# Patient Record
Sex: Female | Born: 1985 | Race: White | Hispanic: No | Smoking: Former smoker
Health system: Southern US, Community
[De-identification: ages and names within clinical notes are randomized; demographics above are authoritative.]

## PROBLEM LIST (undated history)

## (undated) DIAGNOSIS — R87619 Unspecified abnormal cytological findings in specimens from cervix uteri: Secondary | ICD-10-CM

## (undated) DIAGNOSIS — F419 Anxiety disorder, unspecified: Secondary | ICD-10-CM

## (undated) DIAGNOSIS — R1115 Cyclical vomiting syndrome unrelated to migraine: Secondary | ICD-10-CM

## (undated) HISTORY — DX: Unspecified abnormal cytological findings in specimens from cervix uteri: R87.619

## (undated) HISTORY — DX: Anxiety disorder, unspecified: F41.9

## (undated) HISTORY — DX: Cyclical vomiting syndrome unrelated to migraine: R11.15

## (undated) HISTORY — PX: ANKLE SURGERY: SHX546

---

## 2005-06-15 ENCOUNTER — Other Ambulatory Visit: Admission: RE | Admit: 2005-06-15 | Discharge: 2005-06-15 | Payer: Self-pay | Admitting: Obstetrics and Gynecology

## 2005-08-13 ENCOUNTER — Ambulatory Visit (HOSPITAL_COMMUNITY): Admission: RE | Admit: 2005-08-13 | Discharge: 2005-08-13 | Payer: Self-pay | Admitting: Obstetrics and Gynecology

## 2005-12-30 ENCOUNTER — Inpatient Hospital Stay (HOSPITAL_COMMUNITY): Admission: AD | Admit: 2005-12-30 | Discharge: 2006-01-01 | Payer: Self-pay | Admitting: Obstetrics and Gynecology

## 2005-12-30 ENCOUNTER — Encounter (INDEPENDENT_AMBULATORY_CARE_PROVIDER_SITE_OTHER): Payer: Self-pay | Admitting: *Deleted

## 2008-02-21 ENCOUNTER — Ambulatory Visit: Payer: Self-pay | Admitting: Physician Assistant

## 2008-02-28 ENCOUNTER — Ambulatory Visit (HOSPITAL_COMMUNITY): Admission: RE | Admit: 2008-02-28 | Discharge: 2008-02-28 | Payer: Self-pay | Admitting: Obstetrics & Gynecology

## 2008-03-28 ENCOUNTER — Ambulatory Visit: Payer: Self-pay | Admitting: Obstetrics & Gynecology

## 2008-04-02 ENCOUNTER — Ambulatory Visit (HOSPITAL_COMMUNITY): Admission: RE | Admit: 2008-04-02 | Discharge: 2008-04-02 | Payer: Self-pay | Admitting: Obstetrics & Gynecology

## 2008-04-17 ENCOUNTER — Ambulatory Visit (HOSPITAL_COMMUNITY): Admission: RE | Admit: 2008-04-17 | Discharge: 2008-04-17 | Payer: Self-pay | Admitting: Obstetrics & Gynecology

## 2008-05-01 ENCOUNTER — Ambulatory Visit: Payer: Self-pay | Admitting: Family

## 2008-05-29 ENCOUNTER — Ambulatory Visit: Payer: Self-pay | Admitting: Obstetrics and Gynecology

## 2008-06-11 ENCOUNTER — Ambulatory Visit: Payer: Self-pay | Admitting: Obstetrics and Gynecology

## 2008-06-15 ENCOUNTER — Ambulatory Visit: Payer: Self-pay | Admitting: Physician Assistant

## 2008-06-18 ENCOUNTER — Encounter: Admission: RE | Admit: 2008-06-18 | Discharge: 2008-06-18 | Payer: Self-pay | Admitting: Obstetrics & Gynecology

## 2008-06-19 ENCOUNTER — Encounter: Payer: Self-pay | Admitting: Obstetrics and Gynecology

## 2008-06-19 LAB — CONVERTED CEMR LAB
MCHC: 33.6 g/dL (ref 30.0–36.0)
Platelets: 246 10*3/uL (ref 150–400)
RDW: 13.1 % (ref 11.5–15.5)

## 2008-07-06 ENCOUNTER — Ambulatory Visit: Payer: Self-pay | Admitting: Physician Assistant

## 2008-07-09 ENCOUNTER — Ambulatory Visit: Payer: Self-pay | Admitting: Occupational Medicine

## 2008-07-23 ENCOUNTER — Ambulatory Visit: Payer: Self-pay | Admitting: Obstetrics and Gynecology

## 2008-08-08 ENCOUNTER — Ambulatory Visit: Payer: Self-pay | Admitting: Obstetrics & Gynecology

## 2008-08-17 ENCOUNTER — Ambulatory Visit: Payer: Self-pay | Admitting: Family

## 2008-08-17 LAB — CONVERTED CEMR LAB: Chlamydia, DNA Probe: NEGATIVE

## 2008-08-18 ENCOUNTER — Encounter: Payer: Self-pay | Admitting: Family

## 2008-08-27 ENCOUNTER — Ambulatory Visit: Payer: Self-pay | Admitting: Family

## 2008-09-03 ENCOUNTER — Ambulatory Visit: Payer: Self-pay | Admitting: Obstetrics and Gynecology

## 2008-09-06 ENCOUNTER — Inpatient Hospital Stay (HOSPITAL_COMMUNITY): Admission: AD | Admit: 2008-09-06 | Discharge: 2008-09-07 | Payer: Self-pay | Admitting: Obstetrics & Gynecology

## 2008-09-06 ENCOUNTER — Ambulatory Visit: Payer: Self-pay | Admitting: Family

## 2008-10-19 ENCOUNTER — Ambulatory Visit: Payer: Self-pay | Admitting: Family

## 2008-11-09 ENCOUNTER — Encounter: Payer: Self-pay | Admitting: Physician Assistant

## 2008-11-09 ENCOUNTER — Ambulatory Visit: Payer: Self-pay | Admitting: Family

## 2008-11-09 ENCOUNTER — Other Ambulatory Visit: Admission: RE | Admit: 2008-11-09 | Discharge: 2008-11-09 | Payer: Self-pay | Admitting: Obstetrics & Gynecology

## 2009-11-18 ENCOUNTER — Ambulatory Visit: Payer: Self-pay | Admitting: Family

## 2009-11-18 ENCOUNTER — Other Ambulatory Visit: Admission: RE | Admit: 2009-11-18 | Discharge: 2009-11-18 | Payer: Self-pay | Admitting: Obstetrics & Gynecology

## 2009-11-19 ENCOUNTER — Encounter: Payer: Self-pay | Admitting: Family

## 2010-09-22 ENCOUNTER — Encounter: Payer: Self-pay | Admitting: Obstetrics & Gynecology

## 2010-12-15 LAB — CBC
HCT: 40.3 % (ref 36.0–46.0)
Hemoglobin: 13.7 g/dL (ref 12.0–15.0)
MCHC: 33.9 g/dL (ref 30.0–36.0)
MCV: 104.1 fL — ABNORMAL HIGH (ref 78.0–100.0)
RBC: 3.87 MIL/uL (ref 3.87–5.11)

## 2011-01-13 NOTE — Assessment & Plan Note (Signed)
NAMEDAN, DISSINGER             ACCOUNT NO.:  [id number]   MEDICAL RECORD NO.:  [id number]          PATIENT TYPE:  POB   LOCATION:  [facility]         FACILITY:  [facility]   PHYSICIAN:  Sid Falcon, CNM  DATE OF BIRTH:  12-13-1985   DATE OF SERVICE:  11/18/2009                                  CLINIC NOTE   The prior patient of practice denied any new medical diagnoses in the  past year and then no new family history.   DIAGNOSES:  Last menstrual period, has not had a cycle since delivery is  still, continuing to breast feed, last unprotected intercourse was 1  week ago.  Was not able to get the colposcopy for the abnormal Pap  smear, low grade squamous intraepithelial lesion due to financial  resources.  The patient also is reporting a right upper quadrant  abdominal pain, have noticed after eating and positive vaginal itching.   PHYSICAL EXAMINATION:  VITAL SIGNS:  Stable.  Pulse 91, blood pressure  121/81, weight 114.  NECK:  No thyromegaly.  CARDIOVASCULAR SYSTEM:  Regular rate and rhythm without murmurs,  gallops, or rubs.  LUNGS:  Clear to her auscultation bilaterally.  BREASTS:  Soft, nontender, no dominant masses.  No retractions.  No  dimpling.  No nipple discharge.  Positive Murphy sign with palpation.  ABDOMEN:  No hepatosplenomegaly.  PELVIS:  Vaginal outside, no external lesions; internal, thin, watery,  yellowish green prostate discharge.  Cervix no abnormal lesions.  Negative cervical motion tenderness.  No discharge from os.   ASSESSMENT:  1. Well-woman exam.  2. Abnormal vaginal discharge.  3. Abdominal pain.   PLAN:  Wet prep to lab, GC and CT to lab.  Pap smear obtained and sent  to lab.  The patient instructed to call [facility] for an  appointment for colposcopy, could pay according to sliding fee scale.  A  prescription given for Garnette Scheuermann, birth control pills 1 daily to abstain  from sex, 1 additional week, take a pregnancy test  if negative.  Begin  onset of pills with backup method for a week and discussed nutrition and  abstaining from high sat, high acidic foods, and to keep a food diary of  when the pain is noted and after eating what type of food.  We will  follow up as needed.      Sid Falcon, CNM     WM/MEDQ  D:  11/18/2009  T:  11/19/2009  Job:  [id number]

## 2011-01-13 NOTE — Assessment & Plan Note (Signed)
Sydney Ryan, Sydney Ryan             ACCOUNT NO.:  [id number]   MEDICAL RECORD NO.:  [id number]          PATIENT TYPE:  POB   LOCATION:  [facility]         FACILITY:  [facility]   PHYSICIAN:  Sid Falcon, CNM  DATE OF BIRTH:  [birth year]   DATE OF SERVICE:                                  CLINIC NOTE   The patient is here for 6-week postpartum exam.  The patient reports  breast-feeding without difficulty.  Bleeding has ceased 1-1/2 weeks ago,  has been sexually active without difficulty.  Has continued with the  Micronor and wants to not switch at this time.  The patient denies  problems with anxiety or depression.  No additional questions or  concerns.  The patient will schedule an appointment in 4 weeks for well-  woman exam.      Sid Falcon, CNM     WM/MEDQ  D:  10/19/2008  T:  10/20/2008  Job:  [id number]

## 2011-01-13 NOTE — Assessment & Plan Note (Signed)
Sydney Ryan, Sydney Ryan             ACCOUNT NO.:  [id number]   MEDICAL RECORD NO.:  [id number]          PATIENT TYPE:  POB   LOCATION:  [facility]         FACILITY:  [facility]   PHYSICIAN:  Sid Falcon, CNM  DATE OF BIRTH:  30-Sep-1985   DATE OF SERVICE:  11/09/2008                                  CLINIC NOTE   The patient is here for a well-woman exam.  No change in medical history  since pregnancy.  Positive breast-feeding without difficulty.   CURRENT MEDICATIONS:  Prenatal vitamins and Kariva oral contraceptive  pills.  The patient would like to continue and does not desire estrogen-  containing OCPs.   Denies any problems at this time and reports decrease in her anxiety.   PHYSICAL EXAMINATION:  GENERAL:  Alert and oriented x3.  No signs of  acute distress.  NECK:  No thyromegaly, nontender, no dominant masses.  CHEST:  Breasts soft, nontender, no dominant masses.  No nipple  discharge.  No retractions bilateral.  LUNGS:  Clear throughout auscultation bilaterally.  CARDIOVASCULAR SYSTEM:  Regular rate and rhythm without murmurs,  gallops, or rubs.  ABDOMEN:  No hepatosplenomegaly.  Positive bowel sounds x4.  GENITOURINARY:  Pelvic, no abnormal lesions.  Vagina, no abnormal  discharge.  Cervix, nontender.  Negative cervical motion tenderness.  No  abnormal lesions.  No abnormal discharge.  Uterus, midline, nontender,  no dominant masses.  Adnexa, nontender.  No dominant masses bilaterally.  EXTREMITIES:  Without edema.  2+ bilateral patellar DTRs.   ASSESSMENT:  1. Well-woman exam.  2. History of abnormal Pap smears.   PLAN:  Discussed the patient should consider using a more effective form  of birth control as the [place] pills have a decreased effectiveness  compared to estrogen-containing birth control pills.  Also recommended  the IUD is a possible method.  Prescription Kariva with refills up to 1  year.   LABORATORY DATA:  Pap smear.   PLAN:  Await Pap  results to determine Pap smear followup.  Well-woman  exam in 1 year.     Sid Falcon, CNM    WM/MEDQ  D:  11/09/2008  T:  11/09/2008  Job:  [id number]

## 2011-08-20 ENCOUNTER — Encounter: Payer: Self-pay | Admitting: *Deleted

## 2011-08-21 ENCOUNTER — Encounter: Payer: Self-pay | Admitting: Physician Assistant

## 2011-08-21 ENCOUNTER — Ambulatory Visit (INDEPENDENT_AMBULATORY_CARE_PROVIDER_SITE_OTHER): Payer: Self-pay | Admitting: Physician Assistant

## 2011-08-21 DIAGNOSIS — Z3480 Encounter for supervision of other normal pregnancy, unspecified trimester: Secondary | ICD-10-CM

## 2011-08-21 NOTE — Progress Notes (Signed)
p-73 Pt does not have insurance, she is applying for medicaid.

## 2011-08-21 NOTE — Patient Instructions (Signed)
Upper Respiratory Infection, Adult An upper respiratory infection (URI) is also known as the common cold. It is often caused by a type of germ (virus). Colds are easily spread (contagious). You can pass it to others by kissing, coughing, sneezing, or drinking out of the same glass. Usually, you get better in 1 or 2 weeks.  HOME CARE   Only take medicine as told by your doctor.   Use a warm mist humidifier or breathe in steam from a hot shower.   Drink enough water and fluids to keep your pee (urine) clear or pale yellow.   Get plenty of rest.   Return to work when your temperature is back to normal or as told by your doctor. You may use a face mask and wash your hands to stop your cold from spreading.  GET HELP RIGHT AWAY IF:   After the first few days, you feel you are getting worse.   You have questions about your medicine.   You have chills, shortness of breath, or brown or red spit (mucus).   You have yellow or brown snot (nasal discharge) or pain in the face, especially when you bend forward.   You have a fever, puffy (swollen) neck, pain when you swallow, or white spots in the back of your throat.   You have a bad headache, ear pain, sinus pain, or chest pain.   You have a high-pitched whistling sound when you breathe in and out (wheezing).   You have a lasting cough or cough up blood.   You have sore muscles or a stiff neck.  MAKE SURE YOU:   Understand these instructions.   Will watch your condition.   Will get help right away if you are not doing well or get worse.  Document Released: 02/03/2008 Document Revised: 04/29/2011 Document Reviewed: 12/22/2010 [facility] Patient Information 2012 [place], [place].Pregnancy - Second Trimester The second trimester of pregnancy (3 to 6 months) is a period of rapid growth for you and your baby. At the end of the sixth month, your baby is about 9 inches long and weighs 1 1/2 pounds. You will begin to feel the baby move between 18  and 20 weeks of the pregnancy. This is called quickening. Weight gain is faster. A clear fluid (colostrum) may leak out of your breasts. You may feel small contractions of the womb (uterus). This is known as false labor or Braxton-Hicks contractions. This is like a practice for labor when the baby is ready to be born. Usually, the problems with morning sickness have usually passed by the end of your first trimester. Some women develop small dark blotches (called cholasma, mask of pregnancy) on their face that usually goes away after the baby is born. Exposure to the sun makes the blotches worse. Acne may also develop in some pregnant women and pregnant women who have acne, may find that it goes away. PRENATAL EXAMS  Blood work may continue to be done during prenatal exams. These tests are done to check on your health and the probable health of your baby. Blood work is used to follow your blood levels (hemoglobin). Anemia (low hemoglobin) is common during pregnancy. Iron and vitamins are given to help prevent this. You will also be checked for diabetes between 24 and 28 weeks of the pregnancy. Some of the previous blood tests may be repeated.   The size of the uterus is measured during each visit. This is to make sure that the baby is continuing to  grow properly according to the dates of the pregnancy.   Your blood pressure is checked every prenatal visit. This is to make sure you are not getting toxemia.   Your urine is checked to make sure you do not have an infection, diabetes or protein in the urine.   Your weight is checked often to make sure gains are happening at the suggested rate. This is to ensure that both you and your baby are growing normally.   Sometimes, an ultrasound is performed to confirm the proper growth and development of the baby. This is a test which bounces harmless sound waves off the baby so your caregiver can more accurately determine due dates.  Sometimes, a specialized test  is done on the amniotic fluid surrounding the baby. This test is called an amniocentesis. The amniotic fluid is obtained by sticking a needle into the belly (abdomen). This is done to check the chromosomes in instances where there is a concern about possible genetic problems with the baby. It is also sometimes done near the end of pregnancy if an early delivery is required. In this case, it is done to help make sure the baby's lungs are mature enough for the baby to live outside of the womb. CHANGES OCCURING IN THE SECOND TRIMESTER OF PREGNANCY Your body goes through many changes during pregnancy. They vary from person to person. Talk to your caregiver about changes you notice that you are concerned about.  During the second trimester, you will likely have an increase in your appetite. It is normal to have cravings for certain foods. This varies from person to person and pregnancy to pregnancy.   Your lower abdomen will begin to bulge.   You may have to urinate more often because the uterus and baby are pressing on your bladder. It is also common to get more bladder infections during pregnancy (pain with urination). You can help this by drinking lots of fluids and emptying your bladder before and after intercourse.   You may begin to get stretch marks on your hips, abdomen, and breasts. These are normal changes in the body during pregnancy. There are no exercises or medications to take that prevent this change.   You may begin to develop swollen and bulging veins (varicose veins) in your legs. Wearing support hose, elevating your feet for 15 minutes, 3 to 4 times a day and limiting salt in your diet helps lessen the problem.   Heartburn may develop as the uterus grows and pushes up against the stomach. Antacids recommended by your caregiver helps with this problem. Also, eating smaller meals 4 to 5 times a day helps.   Constipation can be treated with a stool softener or adding bulk to your diet.  Drinking lots of fluids, vegetables, fruits, and whole grains are helpful.   Exercising is also helpful. If you have been very active up until your pregnancy, most of these activities can be continued during your pregnancy. If you have been less active, it is helpful to start an exercise program such as walking.   Hemorrhoids (varicose veins in the rectum) may develop at the end of the second trimester. Warm sitz baths and hemorrhoid cream recommended by your caregiver helps hemorrhoid problems.   Backaches may develop during this time of your pregnancy. Avoid heavy lifting, wear low heal shoes and practice good posture to help with backache problems.   Some pregnant women develop tingling and numbness of their hand and fingers because of swelling and tightening  of ligaments in the wrist (carpel tunnel syndrome). This goes away after the baby is born.   As your breasts enlarge, you may have to get a bigger bra. Get a comfortable, cotton, support bra. Do not get a nursing bra until the last month of the pregnancy if you will be nursing the baby.   You may get a dark line from your belly button to the pubic area called the linea nigra.   You may develop rosy cheeks because of increase blood flow to the face.   You may develop spider looking lines of the face, neck, arms and chest. These go away after the baby is born.  HOME CARE INSTRUCTIONS   It is extremely important to avoid all smoking, herbs, alcohol, and unprescribed drugs during your pregnancy. These chemicals affect the formation and growth of the baby. Avoid these chemicals throughout the pregnancy to ensure the delivery of a healthy infant.   Most of your home care instructions are the same as suggested for the first trimester of your pregnancy. Keep your caregiver's appointments. Follow your caregiver's instructions regarding medication use, exercise and diet.   During pregnancy, you are providing food for you and your baby. Continue  to eat regular, well-balanced meals. Choose foods such as meat, fish, milk and other low fat dairy products, vegetables, fruits, and whole-grain breads and cereals. Your caregiver will tell you of the ideal weight gain.   A physical sexual relationship may be continued up until near the end of pregnancy if there are no other problems. Problems could include early (premature) leaking of amniotic fluid from the membranes, vaginal bleeding, abdominal pain, or other medical or pregnancy problems.   Exercise regularly if there are no restrictions. Check with your caregiver if you are unsure of the safety of some of your exercises. The greatest weight gain will occur in the last 2 trimesters of pregnancy. Exercise will help you:   Control your weight.   Get you in shape for labor and delivery.   Lose weight after you have the baby.   Wear a good support or jogging bra for breast tenderness during pregnancy. This may help if worn during sleep. Pads or tissues may be used in the bra if you are leaking colostrum.   Do not use hot tubs, steam rooms or saunas throughout the pregnancy.   Wear your seat belt at all times when driving. This protects you and your baby if you are in an accident.   Avoid raw meat, uncooked cheese, cat litter boxes and soil used by cats. These carry germs that can cause birth defects in the baby.   The second trimester is also a good time to visit your dentist for your dental health if this has not been done yet. Getting your teeth cleaned is OK. Use a soft toothbrush. Brush gently during pregnancy.   It is easier to loose urine during pregnancy. Tightening up and strengthening the pelvic muscles will help with this problem. Practice stopping your urination while you are going to the bathroom. These are the same muscles you need to strengthen. It is also the muscles you would use as if you were trying to stop from passing gas. You can practice tightening these muscles up 10  times a set and repeating this about 3 times per day. Once you know what muscles to tighten up, do not perform these exercises during urination. It is more likely to contribute to an infection by backing up the urine.  Ask for help if you have financial, counseling or nutritional needs during pregnancy. Your caregiver will be able to offer counseling for these needs as well as refer you for other special needs.   Your skin may become oily. If so, wash your face with mild soap, use non-greasy moisturizer and oil or cream based makeup.  MEDICATIONS AND DRUG USE IN PREGNANCY  Take prenatal vitamins as directed. The vitamin should contain 1 milligram of folic acid. Keep all vitamins out of reach of children. Only a couple vitamins or tablets containing iron may be fatal to a baby or young child when ingested.   Avoid use of all medications, including herbs, over-the-counter medications, not prescribed or suggested by your caregiver. Only take over-the-counter or prescription medicines for pain, discomfort, or fever as directed by your caregiver. Do not use aspirin.   Let your caregiver also know about herbs you may be using.   Alcohol is related to a number of birth defects. This includes fetal alcohol syndrome. All alcohol, in any form, should be avoided completely. Smoking will cause low birth rate and premature babies.   Street or illegal drugs are very harmful to the baby. They are absolutely forbidden. A baby born to an addicted mother will be addicted at birth. The baby will go through the same withdrawal an adult does.  SEEK MEDICAL CARE IF:  You have any concerns or worries during your pregnancy. It is better to call with your questions if you feel they cannot wait, rather than worry about them. SEEK IMMEDIATE MEDICAL CARE IF:   An unexplained oral temperature above 102 F (38.9 C) develops, or as your caregiver suggests.   You have leaking of fluid from the vagina (birth canal). If  leaking membranes are suspected, take your temperature and tell your caregiver of this when you call.   There is vaginal spotting, bleeding, or passing clots. Tell your caregiver of the amount and how many pads are used. Light spotting in pregnancy is common, especially following intercourse.   You develop a bad smelling vaginal discharge with a change in the color from clear to white.   You continue to feel sick to your stomach (nauseated) and have no relief from remedies suggested. You vomit blood or coffee ground-like materials.   You lose more than 2 pounds of weight or gain more than 2 pounds of weight over 1 week, or as suggested by your caregiver.   You notice swelling of your face, hands, feet, or legs.   You get exposed to [place] measles and have never had them.   You are exposed to fifth disease or chickenpox.   You develop belly (abdominal) pain. Round ligament discomfort is a common non-cancerous (benign) cause of abdominal pain in pregnancy. Your caregiver still must evaluate you.   You develop a bad headache that does not go away.   You develop fever, diarrhea, pain with urination, or shortness of breath.   You develop visual problems, blurry, or double vision.   You fall or are in a car accident or any kind of trauma.   There is mental or physical violence at home.  Document Released: 08/11/2001 Document Revised: 04/29/2011 Document Reviewed: 02/13/2009 [facility] Patient Information 2012 [place], [place].

## 2011-08-21 NOTE — Progress Notes (Signed)
   Subjective:    Sydney Ryan is a [id number] [redacted]w[redacted]d being seen today for her first obstetrical visit.  Her obstetrical history is significant for anxiety. Patient does intend to breast feed. Pregnancy history fully reviewed.  Patient reports no bleeding, no contractions, no cramping, no leaking and URI symptoms w/o fever.  Filed Vitals:   08/21/11 0952  BP: 104/69  Temp: 97.7 F (36.5 C)  Weight: 116 lb (52.617 kg)    HISTORY: OB History    Grav Para Term Preterm Abortions TAB SAB Ect Mult Living   3 2 2       2      # Outc Date GA Lbr Len/2nd Wgt Sex Del Anes PTL Lv   1 TRM 5/07 [redacted]w[redacted]d  8lb2oz(3.685kg) M SVD EPI No Yes   2 TRM 1/10 [redacted]w[redacted]d  8lb8oz(3.856kg) M SVD EPI No Yes   Comments: bilat periurethral lac   3 CUR              Past Medical History  Diagnosis Date  . Abnormal Pap smear of cervix     LGSIL  . Anxiety    No past surgical history on file. Family History  Problem Relation Age of Onset  . Hyperlipidemia Maternal Grandfather   . Diabetes Maternal Grandfather      Exam    Uterine Size: 12-13 cm  Pelvic Exam: Deferred  System: Breast:  normal appearance, no masses or tenderness   Skin: normal coloration and turgor, no rashes    Neurologic: oriented, normal, gait normal; reflexes normal and symmetric   Extremities: normal strength, tone, and muscle mass   HEENT nasal congestion   Mouth/Teeth mucous membranes moist, pharynx normal without lesions   Neck supple and no masses   Cardiovascular: regular rate and rhythm   Respiratory:  appears well, vitals normal, no respiratory distress, acyanotic, normal RR, ear and throat exam is normal, neck free of mass or lymphadenopathy, chest clear, no wheezing, crepitations, rhonchi, normal symmetric air entry   Abdomen: soft, non-tender; bowel sounds normal; no masses,  no organomegaly   Urinary: no suprapubic pressure      Assessment:    Pregnancy: [id number] @ [redacted]w[redacted]d Desiring midwifery care      Plan:     Initial labs drawn. Prenatal vitamins. Problem list reviewed and updated. Genetic Screening discussed Quad Screen: requested.  Ultrasound discussed; fetal survey: Will schedule at next visit.  Follow up in 4 weeks.   Sydney Ryan E. 08/21/2011  New OB. Too late for first screen, desires Quad at next visit. Discussed stopping adderall and xanax. Will drawn labs, pap, and cultures at next visit due to insurance coverage. Safe meds for URI discussed.

## 2011-09-01 NOTE — L&D Delivery Note (Signed)
Delivery Note At 3:52 AM a viable and healthy female was delivered via Vaginal, Spontaneous Delivery (Presentation: ; Occiput Anterior).  APGAR: 8, 9; weight - not available .   Placenta status: Intact, Spontaneous.  Cord: 3 vessels with the following complications: None.  Cord pH: n/a  Anesthesia: Epidural  Episiotomy: None Lacerations: None Suture Repair: n/a Est. Blood Loss (mL): 300 cc  Mom to postpartum.  Baby to nursery-stable.  [facility] 02/27/2012, 4:10 AM

## 2011-10-23 ENCOUNTER — Ambulatory Visit (INDEPENDENT_AMBULATORY_CARE_PROVIDER_SITE_OTHER): Payer: Medicaid Other | Admitting: Family

## 2011-10-23 VITALS — BP 110/59 | Temp 98.5°F | Wt 131.0 lb

## 2011-10-23 DIAGNOSIS — Z3490 Encounter for supervision of normal pregnancy, unspecified, unspecified trimester: Secondary | ICD-10-CM | POA: Insufficient documentation

## 2011-10-23 DIAGNOSIS — Z3480 Encounter for supervision of other normal pregnancy, unspecified trimester: Secondary | ICD-10-CM

## 2011-10-23 MED ORDER — PRENATAL VITAMINS PLUS 27-1 MG PO TABS: 1.0000 | ORAL_TABLET | Freq: Every day | ORAL | Status: DC

## 2011-10-23 NOTE — Progress Notes (Signed)
No questions or concerns; reviewed pregnancy schedule, here with FOB Ivin Booty (caught last baby); labs today; schedule ob ultrasound; need pap, gc/ct next visit

## 2011-10-23 NOTE — Progress Notes (Signed)
p-80 

## 2011-10-24 LAB — OBSTETRIC PANEL
Antibody Screen: NEGATIVE
Basophils Absolute: 0 10*3/uL (ref 0.0–0.1)
Eosinophils Relative: 1 % (ref 0–5)
HCT: 37.5 % (ref 36.0–46.0)
Lymphocytes Relative: 20 % (ref 12–46)
MCHC: 33.9 g/dL (ref 30.0–36.0)
MCV: 99.2 fL (ref 78.0–100.0)
Monocytes Absolute: 0.7 10*3/uL (ref 0.1–1.0)
RDW: 13.1 % (ref 11.5–15.5)
Rubella: 40.3 IU/mL — ABNORMAL HIGH
WBC: 11.3 10*3/uL — ABNORMAL HIGH (ref 4.0–10.5)

## 2011-10-24 LAB — HIV ANTIBODY (ROUTINE TESTING W REFLEX): HIV: NONREACTIVE

## 2011-10-29 ENCOUNTER — Ambulatory Visit (HOSPITAL_COMMUNITY)
Admission: RE | Admit: 2011-10-29 | Discharge: 2011-10-29 | Disposition: A | Discharge status: Home / Self Care | Payer: Medicaid Other | Source: Ambulatory Visit | Attending: Family | Admitting: Family

## 2011-10-29 DIAGNOSIS — Z3490 Encounter for supervision of normal pregnancy, unspecified, unspecified trimester: Secondary | ICD-10-CM

## 2011-10-29 DIAGNOSIS — Z1389 Encounter for screening for other disorder: Secondary | ICD-10-CM | POA: Insufficient documentation

## 2011-10-29 DIAGNOSIS — O358XX0 Maternal care for other (suspected) fetal abnormality and damage, not applicable or unspecified: Secondary | ICD-10-CM | POA: Insufficient documentation

## 2011-10-29 DIAGNOSIS — Z363 Encounter for antenatal screening for malformations: Secondary | ICD-10-CM | POA: Insufficient documentation

## 2011-11-01 ENCOUNTER — Encounter: Payer: Self-pay | Admitting: Family

## 2011-11-03 ENCOUNTER — Encounter: Payer: Medicaid Other | Admitting: Obstetrics & Gynecology

## 2011-11-04 ENCOUNTER — Ambulatory Visit (INDEPENDENT_AMBULATORY_CARE_PROVIDER_SITE_OTHER): Payer: Medicaid Other | Admitting: Obstetrics & Gynecology

## 2011-11-04 VITALS — BP 114/77 | Temp 98.6°F | Wt 129.0 lb

## 2011-11-04 DIAGNOSIS — Z3480 Encounter for supervision of other normal pregnancy, unspecified trimester: Secondary | ICD-10-CM

## 2011-11-04 DIAGNOSIS — O9989 Other specified diseases and conditions complicating pregnancy, childbirth and the puerperium: Secondary | ICD-10-CM

## 2011-11-04 DIAGNOSIS — O26899 Other specified pregnancy related conditions, unspecified trimester: Secondary | ICD-10-CM

## 2011-11-04 LAB — POCT URINALYSIS DIPSTICK
Leukocytes, UA: NEGATIVE
Spec Grav, UA: 1.015
pH, UA: 6.5

## 2011-11-04 NOTE — Progress Notes (Signed)
This is an unscheduled appt. She came in for 2 days of abdominal pain/constipation. She reports good FM, denies VB and ROM. Her cervix exam is normal and we have discussed constipation treatment. PTL precautions.

## 2011-11-04 NOTE — Progress Notes (Signed)
p-72  Increase abd and back pain.  She has not had a BM in 2 days and the last was harder than normal.  She was worried about the pregnancy since the abd pain was so intense.

## 2011-11-30 ENCOUNTER — Ambulatory Visit (INDEPENDENT_AMBULATORY_CARE_PROVIDER_SITE_OTHER): Payer: Medicaid Other | Admitting: Advanced Practice Midwife

## 2011-11-30 ENCOUNTER — Other Ambulatory Visit (HOSPITAL_COMMUNITY)
Admission: RE | Admit: 2011-11-30 | Discharge: 2011-11-30 | Disposition: A | Discharge status: Home / Self Care | Payer: Medicaid Other | Source: Ambulatory Visit | Attending: Obstetrics & Gynecology | Admitting: Obstetrics & Gynecology

## 2011-11-30 ENCOUNTER — Encounter: Payer: Self-pay | Admitting: Advanced Practice Midwife

## 2011-11-30 VITALS — BP 110/60 | Temp 98.0°F | Wt 138.0 lb

## 2011-11-30 DIAGNOSIS — Z01419 Encounter for gynecological examination (general) (routine) without abnormal findings: Secondary | ICD-10-CM | POA: Insufficient documentation

## 2011-11-30 DIAGNOSIS — Z87898 Personal history of other specified conditions: Secondary | ICD-10-CM

## 2011-11-30 DIAGNOSIS — Z3480 Encounter for supervision of other normal pregnancy, unspecified trimester: Secondary | ICD-10-CM

## 2011-11-30 DIAGNOSIS — Z113 Encounter for screening for infections with a predominantly sexual mode of transmission: Secondary | ICD-10-CM | POA: Insufficient documentation

## 2011-11-30 DIAGNOSIS — Z8742 Personal history of other diseases of the female genital tract: Secondary | ICD-10-CM

## 2011-11-30 DIAGNOSIS — Z3490 Encounter for supervision of normal pregnancy, unspecified, unspecified trimester: Secondary | ICD-10-CM

## 2011-11-30 NOTE — Progress Notes (Signed)
Well, doing 28 week labs next visit, pap and GC done today. Rev'd kick counts, PTL, bleeding precautions.

## 2011-11-30 NOTE — Patient Instructions (Signed)
Pregnancy - Third Trimester The third trimester of pregnancy (the last 3 months) is a period of the most rapid growth for you and your baby. The baby approaches a length of 20 inches and a weight of 6 to 10 pounds. The baby is adding on fat and getting ready for life outside your body. While inside, babies have periods of sleeping and waking, suck their thumbs, and hiccups. You can often feel small contractions of the uterus. This is false labor. It is also called Braxton-Hicks contractions. This is like a practice for labor. The usual problems in this stage of pregnancy include more difficulty breathing, swelling of the hands and feet from water retention, and having to urinate more often because of the uterus and baby pressing on your bladder.  PRENATAL EXAMS  Blood work may continue to be done during prenatal exams. These tests are done to check on your health and the probable health of your baby. Blood work is used to follow your blood levels (hemoglobin). Anemia (low hemoglobin) is common during pregnancy. Iron and vitamins are given to help prevent this. You may also continue to be checked for diabetes. Some of the past blood tests may be done again.   The size of the uterus is measured during each visit. This makes sure your baby is growing properly according to your pregnancy dates.   Your blood pressure is checked every prenatal visit. This is to make sure you are not getting toxemia.   Your urine is checked every prenatal visit for infection, diabetes and protein.   Your weight is checked at each visit. This is done to make sure gains are happening at the suggested rate and that you and your baby are growing normally.   Sometimes, an ultrasound is performed to confirm the position and the proper growth and development of the baby. This is a test done that bounces harmless sound waves off the baby so your caregiver can more accurately determine due dates.   Discuss the type of pain  medication and anesthesia you will have during your labor and delivery.   Discuss the possibility and anesthesia if a Cesarean Section might be necessary.   Inform your caregiver if there is any mental or physical violence at home.  Sometimes, a specialized non-stress test, contraction stress test and biophysical profile are done to make sure the baby is not having a problem. Checking the amniotic fluid surrounding the baby is called an amniocentesis. The amniotic fluid is removed by sticking a needle into the belly (abdomen). This is sometimes done near the end of pregnancy if an early delivery is required. In this case, it is done to help make sure the baby's lungs are mature enough for the baby to live outside of the womb. If the lungs are not mature and it is unsafe to deliver the baby, an injection of cortisone medication is given to the mother 1 to 2 days before the delivery. This helps the baby's lungs mature and makes it safer to deliver the baby. CHANGES OCCURING IN THE THIRD TRIMESTER OF PREGNANCY Your body goes through many changes during pregnancy. They vary from person to person. Talk to your caregiver about changes you notice and are concerned about.  During the last trimester, you have probably had an increase in your appetite. It is normal to have cravings for certain foods. This varies from person to person and pregnancy to pregnancy.   You may begin to get stretch marks on your hips,   abdomen, and breasts. These are normal changes in the body during pregnancy. There are no exercises or medications to take which prevent this change.   Constipation may be treated with a stool softener or adding bulk to your diet. Drinking lots of fluids, fiber in vegetables, fruits, and whole grains are helpful.   Exercising is also helpful. If you have been very active up until your pregnancy, most of these activities can be continued during your pregnancy. If you have been less active, it is helpful  to start an exercise program such as walking. Consult your caregiver before starting exercise programs.   Avoid all smoking, alcohol, un-prescribed drugs, herbs and "street drugs" during your pregnancy. These chemicals affect the formation and growth of the baby. Avoid chemicals throughout the pregnancy to ensure the delivery of a healthy infant.   Backache, varicose veins and hemorrhoids may develop or get worse.   You will tire more easily in the third trimester, which is normal.   The baby's movements may be stronger and more often.   You may become short of breath easily.   Your belly button may stick out.   A yellow discharge may leak from your breasts called colostrum.   You may have a bloody mucus discharge. This usually occurs a few days to a week before labor begins.  HOME CARE INSTRUCTIONS   Keep your caregiver's appointments. Follow your caregiver's instructions regarding medication use, exercise, and diet.   During pregnancy, you are providing food for you and your baby. Continue to eat regular, well-balanced meals. Choose foods such as meat, fish, milk and other low fat dairy products, vegetables, fruits, and whole-grain breads and cereals. Your caregiver will tell you of the ideal weight gain.   A physical sexual relationship may be continued throughout pregnancy if there are no other problems such as early (premature) leaking of amniotic fluid from the membranes, vaginal bleeding, or belly (abdominal) pain.   Exercise regularly if there are no restrictions. Check with your caregiver if you are unsure of the safety of your exercises. Greater weight gain will occur in the last 2 trimesters of pregnancy. Exercising helps:   Control your weight.   Get you in shape for labor and delivery.   You lose weight after you deliver.   Rest a lot with legs elevated, or as needed for leg cramps or low back pain.   Wear a good support or jogging bra for breast tenderness during  pregnancy. This may help if worn during sleep. Pads or tissues may be used in the bra if you are leaking colostrum.   Do not use hot tubs, steam rooms, or saunas.   Wear your seat belt when driving. This protects you and your baby if you are in an accident.   Avoid raw meat, cat litter boxes and soil used by cats. These carry germs that can cause birth defects in the baby.   It is easier to loose urine during pregnancy. Tightening up and strengthening the pelvic muscles will help with this problem. You can practice stopping your urination while you are going to the bathroom. These are the same muscles you need to strengthen. It is also the muscles you would use if you were trying to stop from passing gas. You can practice tightening these muscles up 10 times a set and repeating this about 3 times per day. Once you know what muscles to tighten up, do not perform these exercises during urination. It is more likely   to cause an infection by backing up the urine.   Ask for help if you have financial, counseling or nutritional needs during pregnancy. Your caregiver will be able to offer counseling for these needs as well as refer you for other special needs.   Make a list of emergency phone numbers and have them available.   Plan on getting help from family or friends when you go home from the hospital.   Make a trial run to the hospital.   Take prenatal classes with the father to understand, practice and ask questions about the labor and delivery.   Prepare the baby's room/nursery.   Do not travel out of the city unless it is absolutely necessary and with the advice of your caregiver.   Wear only low or no heal shoes to have better balance and prevent falling.  MEDICATIONS AND DRUG USE IN PREGNANCY  Take prenatal vitamins as directed. The vitamin should contain 1 milligram of folic acid. Keep all vitamins out of reach of children. Only a couple vitamins or tablets containing iron may be fatal  to a baby or young child when ingested.   Avoid use of all medications, including herbs, over-the-counter medications, not prescribed or suggested by your caregiver. Only take over-the-counter or prescription medicines for pain, discomfort, or fever as directed by your caregiver. Do not use aspirin, ibuprofen (Motrin, Advil, Nuprin) or naproxen (Aleve) unless OK'd by your caregiver.   Let your caregiver also know about herbs you may be using.   Alcohol is related to a number of birth defects. This includes fetal alcohol syndrome. All alcohol, in any form, should be avoided completely. Smoking will cause low birth rate and premature babies.   Street/illegal drugs are very harmful to the baby. They are absolutely forbidden. A baby born to an addicted mother will be addicted at birth. The baby will go through the same withdrawal an adult does.  SEEK MEDICAL CARE IF: You have any concerns or worries during your pregnancy. It is better to call with your questions if you feel they cannot wait, rather than worry about them. DECISIONS ABOUT CIRCUMCISION You may or may not know the sex of your baby. If you know your baby is a boy, it may be time to think about circumcision. Circumcision is the removal of the foreskin of the penis. This is the skin that covers the sensitive end of the penis. There is no proven medical need for this. Often this decision is made on what is popular at the time or based upon religious beliefs and social issues. You can discuss these issues with your caregiver or pediatrician. SEEK IMMEDIATE MEDICAL CARE IF:   An unexplained oral temperature above 102 F (38.9 C) develops, or as your caregiver suggests.   You have leaking of fluid from the vagina (birth canal). If leaking membranes are suspected, take your temperature and tell your caregiver of this when you call.   There is vaginal spotting, bleeding or passing clots. Tell your caregiver of the amount and how many pads are  used.   You develop a bad smelling vaginal discharge with a change in the color from clear to white.   You develop vomiting that lasts more than 24 hours.   You develop chills or fever.   You develop shortness of breath.   You develop burning on urination.   You loose more than 2 pounds of weight or gain more than 2 pounds of weight or as suggested by your   caregiver.   You notice sudden swelling of your face, hands, and feet or legs.   You develop belly (abdominal) pain. Round ligament discomfort is a common non-cancerous (benign) cause of abdominal pain in pregnancy. Your caregiver still must evaluate you.   You develop a severe headache that does not go away.   You develop visual problems, blurred or double vision.   If you have not felt your baby move for more than 1 hour. If you think the baby is not moving as much as usual, eat something with sugar in it and lie down on your left side for an hour. The baby should move at least 4 to 5 times per hour. Call right away if your baby moves less than that.   You fall, are in a car accident or any kind of trauma.   There is mental or physical violence at home.  Document Released: 08/11/2001 Document Revised: 08/06/2011 Document Reviewed: 02/13/2009 [employer] Patient Information 2012 [employer]. 

## 2011-11-30 NOTE — Progress Notes (Signed)
p-[id number]  Pt will do 28 wk labs next week  Pt does need her Pap smear and GC/CT done today

## 2011-12-02 ENCOUNTER — Telehealth: Payer: Self-pay | Admitting: *Deleted

## 2011-12-02 NOTE — Telephone Encounter (Signed)
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  She needs to be scheduled for a colposcopy here in the office.  Pt is to check her schedule and call back to schedule.  Pt wanted to postpone until after birth of baby but since she never followed up on last abnormal pap, I told her it needs to be done now.

## 2011-12-04 ENCOUNTER — Other Ambulatory Visit: Payer: Medicaid Other | Admitting: *Deleted

## 2011-12-04 ENCOUNTER — Other Ambulatory Visit: Payer: Self-pay | Admitting: Advanced Practice Midwife

## 2011-12-05 LAB — CBC
HCT: 38.7 % (ref 36.0–46.0)
Hemoglobin: 12.7 g/dL (ref 12.0–15.0)
MCH: 33.4 pg (ref 26.0–34.0)
MCV: 101.8 fL — ABNORMAL HIGH (ref 78.0–100.0)
RBC: 3.8 MIL/uL — ABNORMAL LOW (ref 3.87–5.11)

## 2011-12-18 ENCOUNTER — Telehealth: Payer: Self-pay | Admitting: Family

## 2011-12-18 ENCOUNTER — Ambulatory Visit (INDEPENDENT_AMBULATORY_CARE_PROVIDER_SITE_OTHER): Payer: Medicaid Other | Admitting: Family

## 2011-12-18 VITALS — BP 109/67 | Temp 98.2°F | Wt 137.0 lb

## 2011-12-18 DIAGNOSIS — Z3480 Encounter for supervision of other normal pregnancy, unspecified trimester: Secondary | ICD-10-CM

## 2011-12-18 DIAGNOSIS — IMO0002 Reserved for concepts with insufficient information to code with codable children: Secondary | ICD-10-CM

## 2011-12-18 DIAGNOSIS — Z3490 Encounter for supervision of normal pregnancy, unspecified, unspecified trimester: Secondary | ICD-10-CM

## 2011-12-18 DIAGNOSIS — R87613 High grade squamous intraepithelial lesion on cytologic smear of cervix (HGSIL): Secondary | ICD-10-CM

## 2011-12-18 MED ORDER — NEOMYCIN-POLYMYXIN-HC 3.5-10000-1 OT SOLN: 3.0000 [drp] | Freq: Four times a day (QID) | OTIC | Status: AC

## 2011-12-18 NOTE — Progress Notes (Signed)
Can feel heart beating in her ear.  P-[id number]   Electrical shock pain  In both legs when she stands up.

## 2011-12-18 NOTE — Progress Notes (Signed)
Starting to feel tired; discussed high grade lesion on pap smear; pt does not want procedures done; discussed concern regarding result.  Ears - left otitis externa> RX cortisporin otic; tympanic membrane intact bilat, no redness on eardrum; left canal inflammed.

## 2011-12-18 NOTE — Telephone Encounter (Signed)
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

## 2012-01-08 ENCOUNTER — Ambulatory Visit (INDEPENDENT_AMBULATORY_CARE_PROVIDER_SITE_OTHER): Payer: Medicaid Other | Admitting: Obstetrics and Gynecology

## 2012-01-08 VITALS — BP 116/69 | Temp 97.4°F | Wt 143.0 lb

## 2012-01-08 DIAGNOSIS — IMO0002 Reserved for concepts with insufficient information to code with codable children: Secondary | ICD-10-CM

## 2012-01-08 DIAGNOSIS — Z3480 Encounter for supervision of other normal pregnancy, unspecified trimester: Secondary | ICD-10-CM

## 2012-01-08 DIAGNOSIS — Z3493 Encounter for supervision of normal pregnancy, unspecified, third trimester: Secondary | ICD-10-CM

## 2012-01-08 DIAGNOSIS — R87613 High grade squamous intraepithelial lesion on cytologic smear of cervix (HGSIL): Secondary | ICD-10-CM

## 2012-01-08 NOTE — Progress Notes (Signed)
Occ episodes blurry vision relieved with rest, not present now. Also noted with last pregnnacy. Has optometry appt. Tried ear gtts but still occasionally hears her pulse in left ear.  Discussed plans. Works at home.

## 2012-01-08 NOTE — Patient Instructions (Signed)
Pregnancy - Third Trimester The third trimester of pregnancy (the last 3 months) is a period of the most rapid growth for you and your baby. The baby approaches a length of 20 inches and a weight of 6 to 10 pounds. The baby is adding on fat and getting ready for life outside your body. While inside, babies have periods of sleeping and waking, suck their thumbs, and hiccups. You can often feel small contractions of the uterus. This is false labor. It is also called Braxton-Hicks contractions. This is like a practice for labor. The usual problems in this stage of pregnancy include more difficulty breathing, swelling of the hands and feet from water retention, and having to urinate more often because of the uterus and baby pressing on your bladder.  PRENATAL EXAMS  Blood work may continue to be done during prenatal exams. These tests are done to check on your health and the probable health of your baby. Blood work is used to follow your blood levels (hemoglobin). Anemia (low hemoglobin) is common during pregnancy. Iron and vitamins are given to help prevent this. You may also continue to be checked for diabetes. Some of the past blood tests may be done again.   The size of the uterus is measured during each visit. This makes sure your baby is growing properly according to your pregnancy dates.   Your blood pressure is checked every prenatal visit. This is to make sure you are not getting toxemia.   Your urine is checked every prenatal visit for infection, diabetes and protein.   Your weight is checked at each visit. This is done to make sure gains are happening at the suggested rate and that you and your baby are growing normally.   Sometimes, an ultrasound is performed to confirm the position and the proper growth and development of the baby. This is a test done that bounces harmless sound waves off the baby so your caregiver can more accurately determine due dates.   Discuss the type of pain  medication and anesthesia you will have during your labor and delivery.   Discuss the possibility and anesthesia if a Cesarean Section might be necessary.   Inform your caregiver if there is any mental or physical violence at home.  Sometimes, a specialized non-stress test, contraction stress test and biophysical profile are done to make sure the baby is not having a problem. Checking the amniotic fluid surrounding the baby is called an amniocentesis. The amniotic fluid is removed by sticking a needle into the belly (abdomen). This is sometimes done near the end of pregnancy if an early delivery is required. In this case, it is done to help make sure the baby's lungs are mature enough for the baby to live outside of the womb. If the lungs are not mature and it is unsafe to deliver the baby, an injection of cortisone medication is given to the mother 1 to 2 days before the delivery. This helps the baby's lungs mature and makes it safer to deliver the baby. CHANGES OCCURING IN THE THIRD TRIMESTER OF PREGNANCY Your body goes through many changes during pregnancy. They vary from person to person. Talk to your caregiver about changes you notice and are concerned about.  During the last trimester, you have probably had an increase in your appetite. It is normal to have cravings for certain foods. This varies from person to person and pregnancy to pregnancy.   You may begin to get stretch marks on your hips,   abdomen, and breasts. These are normal changes in the body during pregnancy. There are no exercises or medications to take which prevent this change.   Constipation may be treated with a stool softener or adding bulk to your diet. Drinking lots of fluids, fiber in vegetables, fruits, and whole grains are helpful.   Exercising is also helpful. If you have been very active up until your pregnancy, most of these activities can be continued during your pregnancy. If you have been less active, it is helpful  to start an exercise program such as walking. Consult your caregiver before starting exercise programs.   Avoid all smoking, alcohol, un-prescribed drugs, herbs and "street drugs" during your pregnancy. These chemicals affect the formation and growth of the baby. Avoid chemicals throughout the pregnancy to ensure the delivery of a healthy infant.   Backache, varicose veins and hemorrhoids may develop or get worse.   You will tire more easily in the third trimester, which is normal.   The baby's movements may be stronger and more often.   You may become short of breath easily.   Your belly button may stick out.   A yellow discharge may leak from your breasts called colostrum.   You may have a bloody mucus discharge. This usually occurs a few days to a week before labor begins.  HOME CARE INSTRUCTIONS   Keep your caregiver's appointments. Follow your caregiver's instructions regarding medication use, exercise, and diet.   During pregnancy, you are providing food for you and your baby. Continue to eat regular, well-balanced meals. Choose foods such as meat, fish, milk and other low fat dairy products, vegetables, fruits, and whole-grain breads and cereals. Your caregiver will tell you of the ideal weight gain.   A physical sexual relationship may be continued throughout pregnancy if there are no other problems such as early (premature) leaking of amniotic fluid from the membranes, vaginal bleeding, or belly (abdominal) pain.   Exercise regularly if there are no restrictions. Check with your caregiver if you are unsure of the safety of your exercises. Greater weight gain will occur in the last 2 trimesters of pregnancy. Exercising helps:   Control your weight.   Get you in shape for labor and delivery.   You lose weight after you deliver.   Rest a lot with legs elevated, or as needed for leg cramps or low back pain.   Wear a good support or jogging bra for breast tenderness during  pregnancy. This may help if worn during sleep. Pads or tissues may be used in the bra if you are leaking colostrum.   Do not use hot tubs, steam rooms, or saunas.   Wear your seat belt when driving. This protects you and your baby if you are in an accident.   Avoid raw meat, cat litter boxes and soil used by cats. These carry germs that can cause birth defects in the baby.   It is easier to loose urine during pregnancy. Tightening up and strengthening the pelvic muscles will help with this problem. You can practice stopping your urination while you are going to the bathroom. These are the same muscles you need to strengthen. It is also the muscles you would use if you were trying to stop from passing gas. You can practice tightening these muscles up 10 times a set and repeating this about 3 times per day. Once you know what muscles to tighten up, do not perform these exercises during urination. It is more likely   to cause an infection by backing up the urine.   Ask for help if you have financial, counseling or nutritional needs during pregnancy. Your caregiver will be able to offer counseling for these needs as well as refer you for other special needs.   Make a list of emergency phone numbers and have them available.   Plan on getting help from family or friends when you go home from the hospital.   Make a trial run to the hospital.   Take prenatal classes with the father to understand, practice and ask questions about the labor and delivery.   Prepare the baby's room/nursery.   Do not travel out of the city unless it is absolutely necessary and with the advice of your caregiver.   Wear only low or no heal shoes to have better balance and prevent falling.  MEDICATIONS AND DRUG USE IN PREGNANCY  Take prenatal vitamins as directed. The vitamin should contain 1 milligram of folic acid. Keep all vitamins out of reach of children. Only a couple vitamins or tablets containing iron may be fatal  to a baby or young child when ingested.   Avoid use of all medications, including herbs, over-the-counter medications, not prescribed or suggested by your caregiver. Only take over-the-counter or prescription medicines for pain, discomfort, or fever as directed by your caregiver. Do not use aspirin, ibuprofen (Motrin, Advil, Nuprin) or naproxen (Aleve) unless OK'd by your caregiver.   Let your caregiver also know about herbs you may be using.   Alcohol is related to a number of birth defects. This includes fetal alcohol syndrome. All alcohol, in any form, should be avoided completely. Smoking will cause low birth rate and premature babies.   Street/illegal drugs are very harmful to the baby. They are absolutely forbidden. A baby born to an addicted mother will be addicted at birth. The baby will go through the same withdrawal an adult does.  SEEK MEDICAL CARE IF: You have any concerns or worries during your pregnancy. It is better to call with your questions if you feel they cannot wait, rather than worry about them. DECISIONS ABOUT CIRCUMCISION You may or may not know the sex of your baby. If you know your baby is a boy, it may be time to think about circumcision. Circumcision is the removal of the foreskin of the penis. This is the skin that covers the sensitive end of the penis. There is no proven medical need for this. Often this decision is made on what is popular at the time or based upon religious beliefs and social issues. You can discuss these issues with your caregiver or pediatrician. SEEK IMMEDIATE MEDICAL CARE IF:   An unexplained oral temperature above 102 F (38.9 C) develops, or as your caregiver suggests.   You have leaking of fluid from the vagina (birth canal). If leaking membranes are suspected, take your temperature and tell your caregiver of this when you call.   There is vaginal spotting, bleeding or passing clots. Tell your caregiver of the amount and how many pads are  used.   You develop a bad smelling vaginal discharge with a change in the color from clear to white.   You develop vomiting that lasts more than 24 hours.   You develop chills or fever.   You develop shortness of breath.   You develop burning on urination.   You loose more than 2 pounds of weight or gain more than 2 pounds of weight or as suggested by your   caregiver.   You notice sudden swelling of your face, hands, and feet or legs.   You develop belly (abdominal) pain. Round ligament discomfort is a common non-cancerous (benign) cause of abdominal pain in pregnancy. Your caregiver still must evaluate you.   You develop a severe headache that does not go away.   You develop visual problems, blurred or double vision.   If you have not felt your baby move for more than 1 hour. If you think the baby is not moving as much as usual, eat something with sugar in it and lie down on your left side for an hour. The baby should move at least 4 to 5 times per hour. Call right away if your baby moves less than that.   You fall, are in a car accident or any kind of trauma.   There is mental or physical violence at home.  Document Released: 08/11/2001 Document Revised: 08/06/2011 Document Reviewed: 02/13/2009 [employer] Patient Information 2012 [employer]. 

## 2012-01-08 NOTE — Progress Notes (Signed)
P = [id number]   Pt reports some blurry vision.

## 2012-01-22 ENCOUNTER — Encounter: Payer: Self-pay | Admitting: Advanced Practice Midwife

## 2012-01-22 ENCOUNTER — Ambulatory Visit (INDEPENDENT_AMBULATORY_CARE_PROVIDER_SITE_OTHER): Payer: Medicaid Other | Admitting: Advanced Practice Midwife

## 2012-01-22 VITALS — BP 105/61 | Wt 147.0 lb

## 2012-01-22 DIAGNOSIS — IMO0002 Reserved for concepts with insufficient information to code with codable children: Secondary | ICD-10-CM

## 2012-01-22 DIAGNOSIS — Z3490 Encounter for supervision of normal pregnancy, unspecified, unspecified trimester: Secondary | ICD-10-CM

## 2012-01-22 DIAGNOSIS — Z3480 Encounter for supervision of other normal pregnancy, unspecified trimester: Secondary | ICD-10-CM

## 2012-01-22 DIAGNOSIS — R87613 High grade squamous intraepithelial lesion on cytologic smear of cervix (HGSIL): Secondary | ICD-10-CM

## 2012-01-22 NOTE — Progress Notes (Signed)
No concerns. Cant' stay to sign BTL papers. R/B/I/alternatives discussed. Infor given. GBS at [place].

## 2012-01-22 NOTE — Patient Instructions (Signed)
Pregnancy - Third Trimester The third trimester of pregnancy (the last 3 months) is a period of the most rapid growth for you and your baby. The baby approaches a length of 20 inches and a weight of 6 to 10 pounds. The baby is adding on fat and getting ready for life outside your body. While inside, babies have periods of sleeping and waking, suck their thumbs, and hiccups. You can often feel small contractions of the uterus. This is false labor. It is also called Braxton-Hicks contractions. This is like a practice for labor. The usual problems in this stage of pregnancy include more difficulty breathing, swelling of the hands and feet from water retention, and having to urinate more often because of the uterus and baby pressing on your bladder.  PRENATAL EXAMS  Blood work may continue to be done during prenatal exams. These tests are done to check on your health and the probable health of your baby. Blood work is used to follow your blood levels (hemoglobin). Anemia (low hemoglobin) is common during pregnancy. Iron and vitamins are given to help prevent this. You may also continue to be checked for diabetes. Some of the past blood tests may be done again.   The size of the uterus is measured during each visit. This makes sure your baby is growing properly according to your pregnancy dates.   Your blood pressure is checked every prenatal visit. This is to make sure you are not getting toxemia.   Your urine is checked every prenatal visit for infection, diabetes and protein.   Your weight is checked at each visit. This is done to make sure gains are happening at the suggested rate and that you and your baby are growing normally.   Sometimes, an ultrasound is performed to confirm the position and the proper growth and development of the baby. This is a test done that bounces harmless sound waves off the baby so your caregiver can more accurately determine due dates.   Discuss the type of pain  medication and anesthesia you will have during your labor and delivery.   Discuss the possibility and anesthesia if a Cesarean Section might be necessary.   Inform your caregiver if there is any mental or physical violence at home.  Sometimes, a specialized non-stress test, contraction stress test and biophysical profile are done to make sure the baby is not having a problem. Checking the amniotic fluid surrounding the baby is called an amniocentesis. The amniotic fluid is removed by sticking a needle into the belly (abdomen). This is sometimes done near the end of pregnancy if an early delivery is required. In this case, it is done to help make sure the baby's lungs are mature enough for the baby to live outside of the womb. If the lungs are not mature and it is unsafe to deliver the baby, an injection of cortisone medication is given to the mother 1 to 2 days before the delivery. This helps the baby's lungs mature and makes it safer to deliver the baby. CHANGES OCCURING IN THE THIRD TRIMESTER OF PREGNANCY Your body goes through many changes during pregnancy. They vary from person to person. Talk to your caregiver about changes you notice and are concerned about.  During the last trimester, you have probably had an increase in your appetite. It is normal to have cravings for certain foods. This varies from person to person and pregnancy to pregnancy.   You may begin to get stretch marks on your hips,   abdomen, and breasts. These are normal changes in the body during pregnancy. There are no exercises or medications to take which prevent this change.   Constipation may be treated with a stool softener or adding bulk to your diet. Drinking lots of fluids, fiber in vegetables, fruits, and whole grains are helpful.   Exercising is also helpful. If you have been very active up until your pregnancy, most of these activities can be continued during your pregnancy. If you have been less active, it is helpful  to start an exercise program such as walking. Consult your caregiver before starting exercise programs.   Avoid all smoking, alcohol, un-prescribed drugs, herbs and "street drugs" during your pregnancy. These chemicals affect the formation and growth of the baby. Avoid chemicals throughout the pregnancy to ensure the delivery of a healthy infant.   Backache, varicose veins and hemorrhoids may develop or get worse.   You will tire more easily in the third trimester, which is normal.   The baby's movements may be stronger and more often.   You may become short of breath easily.   Your belly button may stick out.   A yellow discharge may leak from your breasts called colostrum.   You may have a bloody mucus discharge. This usually occurs a few days to a week before labor begins.  HOME CARE INSTRUCTIONS   Keep your caregiver's appointments. Follow your caregiver's instructions regarding medication use, exercise, and diet.   During pregnancy, you are providing food for you and your baby. Continue to eat regular, well-balanced meals. Choose foods such as meat, fish, milk and other low fat dairy products, vegetables, fruits, and whole-grain breads and cereals. Your caregiver will tell you of the ideal weight gain.   A physical sexual relationship may be continued throughout pregnancy if there are no other problems such as early (premature) leaking of amniotic fluid from the membranes, vaginal bleeding, or belly (abdominal) pain.   Exercise regularly if there are no restrictions. Check with your caregiver if you are unsure of the safety of your exercises. Greater weight gain will occur in the last 2 trimesters of pregnancy. Exercising helps:   Control your weight.   Get you in shape for labor and delivery.   You lose weight after you deliver.   Rest a lot with legs elevated, or as needed for leg cramps or low back pain.   Wear a good support or jogging bra for breast tenderness during  pregnancy. This may help if worn during sleep. Pads or tissues may be used in the bra if you are leaking colostrum.   Do not use hot tubs, steam rooms, or saunas.   Wear your seat belt when driving. This protects you and your baby if you are in an accident.   Avoid raw meat, cat litter boxes and soil used by cats. These carry germs that can cause birth defects in the baby.   It is easier to loose urine during pregnancy. Tightening up and strengthening the pelvic muscles will help with this problem. You can practice stopping your urination while you are going to the bathroom. These are the same muscles you need to strengthen. It is also the muscles you would use if you were trying to stop from passing gas. You can practice tightening these muscles up 10 times a set and repeating this about 3 times per day. Once you know what muscles to tighten up, do not perform these exercises during urination. It is more likely   to cause an infection by backing up the urine.   Ask for help if you have financial, counseling or nutritional needs during pregnancy. Your caregiver will be able to offer counseling for these needs as well as refer you for other special needs.   Make a list of emergency phone numbers and have them available.   Plan on getting help from family or friends when you go home from the hospital.   Make a trial run to the hospital.   Take prenatal classes with the father to understand, practice and ask questions about the labor and delivery.   Prepare the baby's room/nursery.   Do not travel out of the city unless it is absolutely necessary and with the advice of your caregiver.   Wear only low or no heal shoes to have better balance and prevent falling.  MEDICATIONS AND DRUG USE IN PREGNANCY  Take prenatal vitamins as directed. The vitamin should contain 1 milligram of folic acid. Keep all vitamins out of reach of children. Only a couple vitamins or tablets containing iron may be fatal  to a baby or young child when ingested.   Avoid use of all medications, including herbs, over-the-counter medications, not prescribed or suggested by your caregiver. Only take over-the-counter or prescription medicines for pain, discomfort, or fever as directed by your caregiver. Do not use aspirin, ibuprofen (Motrin, Advil, Nuprin) or naproxen (Aleve) unless OK'd by your caregiver.   Let your caregiver also know about herbs you may be using.   Alcohol is related to a number of birth defects. This includes fetal alcohol syndrome. All alcohol, in any form, should be avoided completely. Smoking will cause low birth rate and premature babies.   Street/illegal drugs are very harmful to the baby. They are absolutely forbidden. A baby born to an addicted mother will be addicted at birth. The baby will go through the same withdrawal an adult does.  SEEK MEDICAL CARE IF: You have any concerns or worries during your pregnancy. It is better to call with your questions if you feel they cannot wait, rather than worry about them. DECISIONS ABOUT CIRCUMCISION You may or may not know the sex of your baby. If you know your baby is a boy, it may be time to think about circumcision. Circumcision is the removal of the foreskin of the penis. This is the skin that covers the sensitive end of the penis. There is no proven medical need for this. Often this decision is made on what is popular at the time or based upon religious beliefs and social issues. You can discuss these issues with your caregiver or pediatrician. SEEK IMMEDIATE MEDICAL CARE IF:   An unexplained oral temperature above 102 F (38.9 C) develops, or as your caregiver suggests.   You have leaking of fluid from the vagina (birth canal). If leaking membranes are suspected, take your temperature and tell your caregiver of this when you call.   There is vaginal spotting, bleeding or passing clots. Tell your caregiver of the amount and how many pads are  used.   You develop a bad smelling vaginal discharge with a change in the color from clear to white.   You develop vomiting that lasts more than 24 hours.   You develop chills or fever.   You develop shortness of breath.   You develop burning on urination.   You loose more than 2 pounds of weight or gain more than 2 pounds of weight or as suggested by your   caregiver.   You notice sudden swelling of your face, hands, and feet or legs.   You develop belly (abdominal) pain. Round ligament discomfort is a common non-cancerous (benign) cause of abdominal pain in pregnancy. Your caregiver still must evaluate you.   You develop a severe headache that does not go away.   You develop visual problems, blurred or double vision.   If you have not felt your baby move for more than 1 hour. If you think the baby is not moving as much as usual, eat something with sugar in it and lie down on your left side for an hour. The baby should move at least 4 to 5 times per hour. Call right away if your baby moves less than that.   You fall, are in a car accident or any kind of trauma.   There is mental or physical violence at home.  Document Released: 08/11/2001 Document Revised: 08/06/2011 Document Reviewed: 02/13/2009 [facility] Patient Information 2012 [place], [place].  Laparoscopic Tubal Ligation Laparoscopic tubal ligation is a procedure that closes the fallopian tubes. Tubal ligation is also known as getting your "tubes tied." It is a brief, common and relatively simple surgical procedure. Tubal ligation is done to cause sterilization. Sterilization means you will not be able to get pregnant or have a baby. Sometimes a tubal ligation may be reversed, but this should not be considered a possibility because of failure to get pregnant and the increased risk of tubal (ectopic) pregnancy. If you want to have future pregnancies, you should not have this procedure. Use other forms of contraception. Tubal ligation  can be done at any time during your menstrual cycle. This procedure has a less than 1% failure rate. Tubal ligation does not protect against sexually transmitted disease. LET YOUR CAREGIVER KNOW ABOUT:  Allergies, especially to medicines.   All the medicines you are taking, even herbs, eyedrops, steroids, creams, and over-the-counter drugs.   The possibility of being pregnant.   Past problems with medicines.   History of blood clots or other blood problems.   Past surgeries, medical or health problems.  RISKS AND COMPLICATIONS  Your caregiver will discuss the risks with you before the procedure. Some problems that can happen after this procedure include:  Infection. A germ starts growing in one of the wounds. This can usually be treated with antibiotic medicines.   Bleeding following surgery. Your surgeon takes every precaution to keep this from happening.   Damage to other organs. If damage to other organs or excessive bleeding should occur, it may be necessary to convert the laparoscopic procedure into an open abdominal procedure. Scarring (adhesions) from past surgeries or disease may also be a cause to change this procedure to an open abdominal operation.   Anesthetic side effects.  BEFORE THE PROCEDURE  Do not take aspirin or blood thinners a week before the procedure. This can cause bleeding. Do as directed by your caregiver.   Do not eat or drink anything 6 to 8 hours before the procedure.   Let your caregiver know if you get a cold or an infection before the procedure.   Arrive at the clinic or hospital 60 minutes before the surgery or as directed.  PROCEDURE   You may be given a mild drug (sedative) to help you relax before the procedure. Once in the operating room, you will be given a general anesthetic to make you sleep (unless you and your caregiver choose a different anesthetic).   Once you are sleeping,  your surgeon makes your abdomen larger with a harmless gas  (carbon dioxide). This makes your organs easier to see and gives room to operate.   A laparoscope is then inserted into your abdomen through a small cut (incision) below the navel. A laparoscope is a thin, lighted, pencil-sized instrument. It is like a telescope. Once inserted, your caregiver can look at the organs inside your abdomen. He or she can see if there is anything abnormal.   Other small instruments also are put into the abdomen through other small openings (portals). Many surgeons attach a video camera to the laparoscope to enlarge the view. The fallopian tubes are located and either burned closed (cauterized) or a plastic clip is placed on them to close the tubes.  Sometimes, your surgeon may take tissue samples (biopsies) from other organs if he or she sees something abnormal. Biopsies can help to diagnose or confirm a disease. The samples are examined under a microscope. AFTER THE PROCEDURE   The gas is released from inside your abdomen.   Your incisions are closed with stitches (sutures). Because these incisions are small (usually less than  inch), there is usually little discomfort following the procedure.   You will rest in a recovery room until you are stable and doing well.   As long as there are no problems, you may be allowed to go home.   Someone will need to drive you home.   You will have some mild discomfort in the throat. This is from the tube placed in your throat while you were sleeping.   You may experience discomfort in the shoulder area from some trapped air between the liver and diaphragm. This will slowly go away on its own.   You will also have some mild abdominal discomfort. You will also have discomfort from the incisions where the instruments were placed into your abdomen.  HOME CARE INSTRUCTIONS   Only take over-the-counter or prescription medicines for pain, discomfort, or fever as directed by your caregiver. Do not take aspirin. It can cause bleeding.    Resume daily activities, diet, rest, driving, and work as directed.   Showers are preferred over baths.   Resume sexual activities in 1 week or as directed.   If biopsies were taken, find out how to get your results. The results are usually given during the follow-up visit. Do not assume tests are normal if you do not hear from your caregiver. It is your responsibility to obtain your results.   Change the dressings as directed.   It is helpful to have someone with you for several hours after you go home. They can help you and be available if you have problems.  SEEK MEDICAL CARE IF:   You have increasing abdominal pain.   You develop new pain in your shoulders in the shoulder strap area that gets worse with time. Some pain is common and expected.   You feel lightheaded or faint.   You have chills or fever.   You develop bleeding or drainage from the suture sites or the vagina after surgery.   You develop chest pain.   You develop shortness of breath.   You develop a rash.  Document Released: 11/23/2000 Document Revised: 08/06/2011 Document Reviewed: 03/07/2008 [facility] Patient Information 2012 [place], [place].

## 2012-01-22 NOTE — Progress Notes (Signed)
p=76 

## 2012-02-01 ENCOUNTER — Ambulatory Visit (INDEPENDENT_AMBULATORY_CARE_PROVIDER_SITE_OTHER): Payer: Medicaid Other | Admitting: Obstetrics and Gynecology

## 2012-02-01 VITALS — BP 115/77 | Temp 98.0°F | Wt 149.0 lb

## 2012-02-01 DIAGNOSIS — Z3480 Encounter for supervision of other normal pregnancy, unspecified trimester: Secondary | ICD-10-CM

## 2012-02-01 LAB — OB RESULTS CONSOLE GBS: GBS: NEGATIVE

## 2012-02-01 NOTE — Progress Notes (Signed)
Tension H/A's - discussed stress relief, try Tylenol. GBS done.

## 2012-02-01 NOTE — Patient Instructions (Signed)
Tension Headache (Muscle Contraction Headache) Tension headache is one of the most common causes of head pain. These headaches are usually felt as a pain over the top of your head and back of your neck. Stress, anxiety, and depression are common triggers for these headaches. Tension headaches are not life-threatening and will not lead to other types of headaches. Tension headaches can often be diagnosed by taking a history from the patient and a physical exam. Sometimes, further lab and x-ray studies are used to confirm the diagnosis. Your caregiver can advise you on how to get help solving problems that cause anxiety or stress. Antidepressants can be prescribed if depression is a problem. HOME CARE INSTRUCTIONS   If testing was done, call for your results. Remember, it is your responsibility to get the results of all testing. Do not assume everything is fine because you do not hear from your caregiver.   Only take over-the-counter or prescription medicines for pain, discomfort, or fever as directed by your caregiver.   Biofeedback, massage, or other relaxation techniques may be helpful.   Ice packs or heat to the head and neck can be used. Use these three to four times per day or as needed.   Physical therapy may be a useful addition to treatment.   If headaches continue, even with therapy, you may need to think about lifestyle changes.   Avoid excessive use of pain killers, as rebound headaches can occur.  SEEK MEDICAL CARE IF:   You develop problems with medications prescribed.   You do not respond or get no relief from medications.   You have a change from the usual headache.   You develop nausea (feeling sick to your stomach) or vomiting.  SEEK IMMEDIATE MEDICAL CARE IF:   Your headache becomes severe.   You have an unexplained oral temperature above 102 F (38.9 C).   You develop a stiff neck.   You have loss of vision.   You have muscular weakness.   You have loss of  muscular control.   You develop severe symptoms different from your first symptoms.   You start losing your balance or have trouble walking.   You feel faint or pass out.  MAKE SURE YOU:   Understand these instructions.   Will watch your condition.   Will get help right away if you are not doing well or get worse.  Document Released: 08/17/2005 Document Revised: 08/06/2011 Document Reviewed: 04/05/2008 [employer] Patient Information 2012 [employer]. 

## 2012-02-01 NOTE — Progress Notes (Signed)
p-84  Frontal headaches

## 2012-02-02 LAB — GC/CHLAMYDIA PROBE AMP, URINE: GC Probe Amp, Urine: NEGATIVE

## 2012-02-08 ENCOUNTER — Ambulatory Visit (INDEPENDENT_AMBULATORY_CARE_PROVIDER_SITE_OTHER): Payer: Medicaid Other | Admitting: Physician Assistant

## 2012-02-08 DIAGNOSIS — Z3400 Encounter for supervision of normal first pregnancy, unspecified trimester: Secondary | ICD-10-CM

## 2012-02-08 NOTE — Progress Notes (Signed)
Patient is here for routine prenatal check, she is doing well except for increased sciatic pain.

## 2012-02-08 NOTE — Patient Instructions (Signed)
Pregnancy - Third Trimester The third trimester of pregnancy (the last 3 months) is a period of the most rapid growth for you and your baby. The baby approaches a length of 20 inches and a weight of 6 to 10 pounds. The baby is adding on fat and getting ready for life outside your body. While inside, babies have periods of sleeping and waking, suck their thumbs, and hiccups. You can often feel small contractions of the uterus. This is false labor. It is also called Braxton-Hicks contractions. This is like a practice for labor. The usual problems in this stage of pregnancy include more difficulty breathing, swelling of the hands and feet from water retention, and having to urinate more often because of the uterus and baby pressing on your bladder.  PRENATAL EXAMS  Blood work may continue to be done during prenatal exams. These tests are done to check on your health and the probable health of your baby. Blood work is used to follow your blood levels (hemoglobin). Anemia (low hemoglobin) is common during pregnancy. Iron and vitamins are given to help prevent this. You may also continue to be checked for diabetes. Some of the past blood tests may be done again.   The size of the uterus is measured during each visit. This makes sure your baby is growing properly according to your pregnancy dates.   Your blood pressure is checked every prenatal visit. This is to make sure you are not getting toxemia.   Your urine is checked every prenatal visit for infection, diabetes and protein.   Your weight is checked at each visit. This is done to make sure gains are happening at the suggested rate and that you and your baby are growing normally.   Sometimes, an ultrasound is performed to confirm the position and the proper growth and development of the baby. This is a test done that bounces harmless sound waves off the baby so your caregiver can more accurately determine due dates.   Discuss the type of pain  medication and anesthesia you will have during your labor and delivery.   Discuss the possibility and anesthesia if a Cesarean Section might be necessary.   Inform your caregiver if there is any mental or physical violence at home.  Sometimes, a specialized non-stress test, contraction stress test and biophysical profile are done to make sure the baby is not having a problem. Checking the amniotic fluid surrounding the baby is called an amniocentesis. The amniotic fluid is removed by sticking a needle into the belly (abdomen). This is sometimes done near the end of pregnancy if an early delivery is required. In this case, it is done to help make sure the baby's lungs are mature enough for the baby to live outside of the womb. If the lungs are not mature and it is unsafe to deliver the baby, an injection of cortisone medication is given to the mother 1 to 2 days before the delivery. This helps the baby's lungs mature and makes it safer to deliver the baby. CHANGES OCCURING IN THE THIRD TRIMESTER OF PREGNANCY Your body goes through many changes during pregnancy. They vary from person to person. Talk to your caregiver about changes you notice and are concerned about.  During the last trimester, you have probably had an increase in your appetite. It is normal to have cravings for certain foods. This varies from person to person and pregnancy to pregnancy.   You may begin to get stretch marks on your hips,   abdomen, and breasts. These are normal changes in the body during pregnancy. There are no exercises or medications to take which prevent this change.   Constipation may be treated with a stool softener or adding bulk to your diet. Drinking lots of fluids, fiber in vegetables, fruits, and whole grains are helpful.   Exercising is also helpful. If you have been very active up until your pregnancy, most of these activities can be continued during your pregnancy. If you have been less active, it is helpful  to start an exercise program such as walking. Consult your caregiver before starting exercise programs.   Avoid all smoking, alcohol, un-prescribed drugs, herbs and "street drugs" during your pregnancy. These chemicals affect the formation and growth of the baby. Avoid chemicals throughout the pregnancy to ensure the delivery of a healthy infant.   Backache, varicose veins and hemorrhoids may develop or get worse.   You will tire more easily in the third trimester, which is normal.   The baby's movements may be stronger and more often.   You may become short of breath easily.   Your belly button may stick out.   A yellow discharge may leak from your breasts called colostrum.   You may have a bloody mucus discharge. This usually occurs a few days to a week before labor begins.  HOME CARE INSTRUCTIONS   Keep your caregiver's appointments. Follow your caregiver's instructions regarding medication use, exercise, and diet.   During pregnancy, you are providing food for you and your baby. Continue to eat regular, well-balanced meals. Choose foods such as meat, fish, milk and other low fat dairy products, vegetables, fruits, and whole-grain breads and cereals. Your caregiver will tell you of the ideal weight gain.   A physical sexual relationship may be continued throughout pregnancy if there are no other problems such as early (premature) leaking of amniotic fluid from the membranes, vaginal bleeding, or belly (abdominal) pain.   Exercise regularly if there are no restrictions. Check with your caregiver if you are unsure of the safety of your exercises. Greater weight gain will occur in the last 2 trimesters of pregnancy. Exercising helps:   Control your weight.   Get you in shape for labor and delivery.   You lose weight after you deliver.   Rest a lot with legs elevated, or as needed for leg cramps or low back pain.   Wear a good support or jogging bra for breast tenderness during  pregnancy. This may help if worn during sleep. Pads or tissues may be used in the bra if you are leaking colostrum.   Do not use hot tubs, steam rooms, or saunas.   Wear your seat belt when driving. This protects you and your baby if you are in an accident.   Avoid raw meat, cat litter boxes and soil used by cats. These carry germs that can cause birth defects in the baby.   It is easier to loose urine during pregnancy. Tightening up and strengthening the pelvic muscles will help with this problem. You can practice stopping your urination while you are going to the bathroom. These are the same muscles you need to strengthen. It is also the muscles you would use if you were trying to stop from passing gas. You can practice tightening these muscles up 10 times a set and repeating this about 3 times per day. Once you know what muscles to tighten up, do not perform these exercises during urination. It is more likely   to cause an infection by backing up the urine.   Ask for help if you have financial, counseling or nutritional needs during pregnancy. Your caregiver will be able to offer counseling for these needs as well as refer you for other special needs.   Make a list of emergency phone numbers and have them available.   Plan on getting help from family or friends when you go home from the hospital.   Make a trial run to the hospital.   Take prenatal classes with the father to understand, practice and ask questions about the labor and delivery.   Prepare the baby's room/nursery.   Do not travel out of the city unless it is absolutely necessary and with the advice of your caregiver.   Wear only low or no heal shoes to have better balance and prevent falling.  MEDICATIONS AND DRUG USE IN PREGNANCY  Take prenatal vitamins as directed. The vitamin should contain 1 milligram of folic acid. Keep all vitamins out of reach of children. Only a couple vitamins or tablets containing iron may be fatal  to a baby or young child when ingested.   Avoid use of all medications, including herbs, over-the-counter medications, not prescribed or suggested by your caregiver. Only take over-the-counter or prescription medicines for pain, discomfort, or fever as directed by your caregiver. Do not use aspirin, ibuprofen (Motrin, Advil, Nuprin) or naproxen (Aleve) unless OK'd by your caregiver.   Let your caregiver also know about herbs you may be using.   Alcohol is related to a number of birth defects. This includes fetal alcohol syndrome. All alcohol, in any form, should be avoided completely. Smoking will cause low birth rate and premature babies.   Street/illegal drugs are very harmful to the baby. They are absolutely forbidden. A baby born to an addicted mother will be addicted at birth. The baby will go through the same withdrawal an adult does.  SEEK MEDICAL CARE IF: You have any concerns or worries during your pregnancy. It is better to call with your questions if you feel they cannot wait, rather than worry about them. DECISIONS ABOUT CIRCUMCISION You may or may not know the sex of your baby. If you know your baby is a boy, it may be time to think about circumcision. Circumcision is the removal of the foreskin of the penis. This is the skin that covers the sensitive end of the penis. There is no proven medical need for this. Often this decision is made on what is popular at the time or based upon religious beliefs and social issues. You can discuss these issues with your caregiver or pediatrician. SEEK IMMEDIATE MEDICAL CARE IF:   An unexplained oral temperature above 102 F (38.9 C) develops, or as your caregiver suggests.   You have leaking of fluid from the vagina (birth canal). If leaking membranes are suspected, take your temperature and tell your caregiver of this when you call.   There is vaginal spotting, bleeding or passing clots. Tell your caregiver of the amount and how many pads are  used.   You develop a bad smelling vaginal discharge with a change in the color from clear to white.   You develop vomiting that lasts more than 24 hours.   You develop chills or fever.   You develop shortness of breath.   You develop burning on urination.   You loose more than 2 pounds of weight or gain more than 2 pounds of weight or as suggested by your   caregiver.   You notice sudden swelling of your face, hands, and feet or legs.   You develop belly (abdominal) pain. Round ligament discomfort is a common non-cancerous (benign) cause of abdominal pain in pregnancy. Your caregiver still must evaluate you.   You develop a severe headache that does not go away.   You develop visual problems, blurred or double vision.   If you have not felt your baby move for more than 1 hour. If you think the baby is not moving as much as usual, eat something with sugar in it and lie down on your left side for an hour. The baby should move at least 4 to 5 times per hour. Call right away if your baby moves less than that.   You fall, are in a car accident or any kind of trauma.   There is mental or physical violence at home.  Document Released: 08/11/2001 Document Revised: 08/06/2011 Document Reviewed: 02/13/2009 [employer] Patient Information 2012 [employer]. 

## 2012-02-08 NOTE — Progress Notes (Signed)
Comfort measures reviewed for sciatica, irregular ctx. +FM daily. No labor s/s. Reviewed labor precautions and neg GBS results. Pt reportsncertain of BTL, questions answered

## 2012-02-16 ENCOUNTER — Ambulatory Visit: Payer: Medicaid Other | Admitting: Obstetrics & Gynecology

## 2012-02-16 NOTE — Progress Notes (Signed)
Has sciatica.  Better today.  For BTL.  GBS negative.  Vertex is low in the pelvis; explains fundal height.  Will strip membranes next week.

## 2012-02-16 NOTE — Progress Notes (Signed)
p-80 

## 2012-02-22 ENCOUNTER — Ambulatory Visit: Payer: Medicaid Other | Admitting: Family

## 2012-02-22 VITALS — BP 110/74 | Wt 155.0 lb

## 2012-02-22 DIAGNOSIS — Z3490 Encounter for supervision of normal pregnancy, unspecified, unspecified trimester: Secondary | ICD-10-CM

## 2012-02-22 NOTE — Progress Notes (Signed)
Reviewed signs of labor; no questions or concerns.

## 2012-02-22 NOTE — Progress Notes (Signed)
Patient is here for routine prenatal check, she is doing well, having some irregular contractions. 

## 2012-02-26 ENCOUNTER — Inpatient Hospital Stay (HOSPITAL_COMMUNITY)
Admission: AD | Admit: 2012-02-26 | Discharge: 2012-02-29 | DRG: 775 | Disposition: A | Discharge status: Home / Self Care | Payer: Medicaid Other | Source: Ambulatory Visit | Attending: Obstetrics and Gynecology | Admitting: Obstetrics and Gynecology

## 2012-02-26 ENCOUNTER — Encounter (HOSPITAL_COMMUNITY): Payer: Self-pay | Admitting: *Deleted

## 2012-02-26 NOTE — MAU Note (Signed)
Started contracting last pm; however, since 2 pm today she has increased contractions, no leaking, no bleeding

## 2012-02-27 ENCOUNTER — Encounter (HOSPITAL_COMMUNITY): Payer: Self-pay | Admitting: *Deleted

## 2012-02-27 ENCOUNTER — Encounter (HOSPITAL_COMMUNITY): Payer: Self-pay | Admitting: Anesthesiology

## 2012-02-27 ENCOUNTER — Inpatient Hospital Stay (HOSPITAL_COMMUNITY): Payer: Medicaid Other | Admitting: Anesthesiology

## 2012-02-27 DIAGNOSIS — O479 False labor, unspecified: Secondary | ICD-10-CM

## 2012-02-27 LAB — CBC
HCT: 39.1 % (ref 36.0–46.0)
MCH: 34.7 pg — ABNORMAL HIGH (ref 26.0–34.0)
MCHC: 35.8 g/dL (ref 30.0–36.0)
RDW: 12.8 % (ref 11.5–15.5)

## 2012-02-27 MED ORDER — SIMETHICONE 80 MG PO CHEW: 80.0000 mg | CHEWABLE_TABLET | ORAL | Status: DC | PRN

## 2012-02-27 MED ORDER — DIPHENHYDRAMINE HCL 25 MG PO CAPS: 25.0000 mg | ORAL_CAPSULE | Freq: Four times a day (QID) | ORAL | Status: DC | PRN

## 2012-02-27 MED ORDER — FENTANYL 2.5 MCG/ML BUPIVACAINE 1/10 % EPIDURAL INFUSION (WH - ANES)
INTRAMUSCULAR | Status: DC | PRN
  Administered 2012-02-27: 14 mL/h via EPIDURAL

## 2012-02-27 MED ORDER — DIBUCAINE 1 % RE OINT: 1.0000 "application " | TOPICAL_OINTMENT | RECTAL | Status: DC | PRN

## 2012-02-27 MED ORDER — LIDOCAINE HCL (PF) 1 % IJ SOLN: 30.0000 mL | INTRAMUSCULAR | Status: DC | PRN

## 2012-02-27 MED ORDER — TETANUS-DIPHTH-ACELL PERTUSSIS 5-2.5-18.5 LF-MCG/0.5 IM SUSP: 0.5000 mL | Freq: Once | INTRAMUSCULAR | Status: DC

## 2012-02-27 MED ORDER — SENNOSIDES-DOCUSATE SODIUM 8.6-50 MG PO TABS
2.0000 | ORAL_TABLET | Freq: Every day | ORAL | Status: DC
  Administered 2012-02-28: 2 via ORAL

## 2012-02-27 MED ORDER — OXYCODONE-ACETAMINOPHEN 5-325 MG PO TABS: 1.0000 | ORAL_TABLET | ORAL | Status: DC | PRN

## 2012-02-27 MED ORDER — OXYTOCIN 40 UNITS IN LACTATED RINGERS INFUSION - SIMPLE MED
62.5000 mL/h | Freq: Once | INTRAVENOUS | Status: AC
  Administered 2012-02-27: 250 mL/h via INTRAVENOUS
  Filled 2012-02-27: qty 1000

## 2012-02-27 MED ORDER — SODIUM BICARBONATE 8.4 % IV SOLN
INTRAVENOUS | Status: DC | PRN
  Administered 2012-02-27: 4 mL via EPIDURAL

## 2012-02-27 MED ORDER — ACETAMINOPHEN 325 MG PO TABS: 650.0000 mg | ORAL_TABLET | ORAL | Status: DC | PRN

## 2012-02-27 MED ORDER — WITCH HAZEL-GLYCERIN EX PADS: 1.0000 "application " | MEDICATED_PAD | CUTANEOUS | Status: DC | PRN

## 2012-02-27 MED ORDER — DIPHENHYDRAMINE HCL 50 MG/ML IJ SOLN: 12.5000 mg | INTRAMUSCULAR | Status: DC | PRN

## 2012-02-27 MED ORDER — EPHEDRINE 5 MG/ML INJ
10.0000 mg | INTRAVENOUS | Status: DC | PRN
  Filled 2012-02-27: qty 4

## 2012-02-27 MED ORDER — LANOLIN HYDROUS EX OINT: TOPICAL_OINTMENT | CUTANEOUS | Status: DC | PRN

## 2012-02-27 MED ORDER — LACTATED RINGERS IV SOLN: INTRAVENOUS | Status: DC

## 2012-02-27 MED ORDER — IBUPROFEN 600 MG PO TABS
600.0000 mg | ORAL_TABLET | Freq: Four times a day (QID) | ORAL | Status: DC
  Administered 2012-02-27 – 2012-02-29 (×9): 600 mg via ORAL
  Filled 2012-02-27 (×9): qty 1

## 2012-02-27 MED ORDER — LACTATED RINGERS IV SOLN
500.0000 mL | Freq: Once | INTRAVENOUS | Status: AC
  Administered 2012-02-27: 500 mL via INTRAVENOUS

## 2012-02-27 MED ORDER — ONDANSETRON HCL 4 MG/2ML IJ SOLN: 4.0000 mg | INTRAMUSCULAR | Status: DC | PRN

## 2012-02-27 MED ORDER — PHENYLEPHRINE 40 MCG/ML (10ML) SYRINGE FOR IV PUSH (FOR BLOOD PRESSURE SUPPORT)
80.0000 ug | PREFILLED_SYRINGE | INTRAVENOUS | Status: DC | PRN
  Filled 2012-02-27: qty 5

## 2012-02-27 MED ORDER — PHENYLEPHRINE 40 MCG/ML (10ML) SYRINGE FOR IV PUSH (FOR BLOOD PRESSURE SUPPORT): 80.0000 ug | PREFILLED_SYRINGE | INTRAVENOUS | Status: DC | PRN

## 2012-02-27 MED ORDER — ONDANSETRON HCL 4 MG PO TABS: 4.0000 mg | ORAL_TABLET | ORAL | Status: DC | PRN

## 2012-02-27 MED ORDER — FENTANYL 2.5 MCG/ML BUPIVACAINE 1/10 % EPIDURAL INFUSION (WH - ANES)
14.0000 mL/h | INTRAMUSCULAR | Status: DC
  Filled 2012-02-27: qty 60

## 2012-02-27 MED ORDER — CITRIC ACID-SODIUM CITRATE 334-500 MG/5ML PO SOLN: 30.0000 mL | ORAL | Status: DC | PRN

## 2012-02-27 MED ORDER — ZOLPIDEM TARTRATE 5 MG PO TABS: 5.0000 mg | ORAL_TABLET | Freq: Every evening | ORAL | Status: DC | PRN

## 2012-02-27 MED ORDER — OXYCODONE-ACETAMINOPHEN 5-325 MG PO TABS
1.0000 | ORAL_TABLET | ORAL | Status: DC | PRN
  Administered 2012-02-27 – 2012-02-29 (×9): 1 via ORAL
  Filled 2012-02-27 (×8): qty 1

## 2012-02-27 MED ORDER — FLEET ENEMA 7-19 GM/118ML RE ENEM: 1.0000 | ENEMA | RECTAL | Status: DC | PRN

## 2012-02-27 MED ORDER — OXYTOCIN BOLUS FROM INFUSION
250.0000 mL | Freq: Once | INTRAVENOUS | Status: DC
  Filled 2012-02-27: qty 500

## 2012-02-27 MED ORDER — PRENATAL MULTIVITAMIN CH
1.0000 | ORAL_TABLET | Freq: Every day | ORAL | Status: DC
  Administered 2012-02-28 – 2012-02-29 (×2): 1 via ORAL
  Filled 2012-02-27 (×2): qty 1

## 2012-02-27 MED ORDER — ONDANSETRON HCL 4 MG/2ML IJ SOLN: 4.0000 mg | Freq: Four times a day (QID) | INTRAMUSCULAR | Status: DC | PRN

## 2012-02-27 MED ORDER — IBUPROFEN 600 MG PO TABS: 600.0000 mg | ORAL_TABLET | Freq: Four times a day (QID) | ORAL | Status: DC | PRN

## 2012-02-27 MED ORDER — LACTATED RINGERS IV SOLN: 500.0000 mL | INTRAVENOUS | Status: DC | PRN

## 2012-02-27 MED ORDER — EPHEDRINE 5 MG/ML INJ: 10.0000 mg | INTRAVENOUS | Status: DC | PRN

## 2012-02-27 MED ORDER — BENZOCAINE-MENTHOL 20-0.5 % EX AERO: 1.0000 "application " | INHALATION_SPRAY | CUTANEOUS | Status: DC | PRN

## 2012-02-27 NOTE — Anesthesia Postprocedure Evaluation (Signed)
  Anesthesia Post-op Note  Patient: Sydney Ryan  Procedure(s) Performed: * No procedures listed *  Patient Location: PACU and Mother/Baby  Anesthesia Type: Epidural  Level of Consciousness: awake, alert  and oriented  Airway and Oxygen Therapy: Patient Spontanous Breathing  Post-op Pain: none  Post-op Assessment: Post-op Vital signs reviewed and Patient's Cardiovascular Status Stable  Post-op Vital Signs: Reviewed and stable  Complications: No apparent anesthesia complications

## 2012-02-27 NOTE — H&P (Signed)
Sydney Ryan is a 26 y.o. female presenting for contractions. Maternal Medical History:  Reason for admission: Reason for admission: contractions.  Contractions: Onset was 13-24 hours ago.   Frequency: regular.   Perceived severity is strong.    Fetal activity: Perceived fetal activity is normal.   Last perceived fetal movement was within the past hour.    Prenatal complications: no prenatal complications   OB History    Grav Para Term Preterm Abortions TAB SAB Ect Mult Living   3 2 2       2      Past Medical History  Diagnosis Date  . Abnormal Pap smear of cervix     LGSIL  . Anxiety    History reviewed. No pertinent past surgical history. Family History: family history includes Diabetes in her maternal grandfather and Hyperlipidemia in her maternal grandfather. Social History:  reports that she has quit smoking. Her smoking use included Cigarettes. She has a 2.7 pack-year smoking history. She has never used smokeless tobacco. She reports that she drinks alcohol. She reports that she uses illicit drugs.   Prenatal Transfer Tool  Maternal Diabetes: No Genetic Screening: Declined Maternal Ultrasounds/Referrals: Normal Fetal Ultrasounds or other Referrals:  None Maternal Substance Abuse:  No Significant Maternal Medications:  None Significant Maternal Lab Results:  Lab values include: Group B Strep negative Other Comments:  None  Review of Systems  Gastrointestinal: Positive for abdominal pain.  All other systems reviewed and are negative.    Dilation:7-8 Effacement (%): 90 Station: 0 Exam by:: Roney Marion, CNM Blood pressure 123/76, pulse 73, temperature 97.4 F (36.3 C), temperature source Oral, resp. rate 20, height 5\' 6"  (1.676 m), weight 71.215 kg (157 lb), last menstrual period 06/16/2011. Maternal Exam:  Uterine Assessment: Contraction strength is firm.  Contraction frequency is regular.   Abdomen: Estimated fetal weight is 7-7.5.   Fetal presentation:  vertex  Introitus: Vulva is positive for vulvar varicosities. Vagina is positive for vaginal discharge (mucusy).  Pelvis: adequate for delivery.      Physical Exam  Constitutional: She is oriented to person, place, and time. She appears well-developed and well-nourished.       Breathing with contractions  HENT:  Head: Normocephalic.  Neck: Normal range of motion. Neck supple.  Cardiovascular: Normal rate, regular rhythm and normal heart sounds.   Respiratory: Effort normal and breath sounds normal.  GI: Soft. There is no tenderness.  Genitourinary: No bleeding around the vagina. Vaginal discharge (mucusy) found.  Neurological: She is alert and oriented to person, place, and time.  Skin: Skin is warm and dry.    FHR 130's, +accels, reactive Toco 2-4  Prenatal labs: ABO, Rh: O/POS/-- (02/22 1027) Antibody: NEG (02/22 1027) Rubella: 40.3 (02/22 1027) RPR: NON REAC (04/05 1011)  HBsAg: NEGATIVE (02/22 1027)  HIV: NON REACTIVE (04/05 1011)  GBS: Negative (06/03 0000)   Assessment/Plan: Active Labor GBS negative  Plan: Admit to Birthing Suites  Epidural when desired Anticipate NSVD  [facility] 02/27/2012, 1:18 AM

## 2012-02-27 NOTE — Anesthesia Preprocedure Evaluation (Signed)

## 2012-02-27 NOTE — Anesthesia Procedure Notes (Signed)

## 2012-02-27 NOTE — Progress Notes (Signed)
   Subjective: Pt reports comfort after epidural.    Objective: BP 115/74  Pulse 91  Temp 97.4 F (36.3 C) (Oral)  Resp 20  Ht 5\' 6"  (1.676 m)  Wt 71.215 kg (157 lb)  BMI 25.34 kg/m2  SpO2 99%  LMP 06/16/2011      FHT:  FHR: 120's bpm, variability: moderate,  accelerations:  Present,  decelerations:  Absent UC:   regular, every 2-4 minutes SVE:   Dilation: 8-9 Effacement (%): 90 Station: +1 Exam by:: Roney Marion, CNM  Labs: Lab Results  Component Value Date   WBC 12.2* 02/27/2012   HGB 14.0 02/27/2012   HCT 39.1 02/27/2012   MCV 96.8 02/27/2012   PLT 234 02/27/2012    Assessment / Plan: Spontaneous labor, progressing normally  Labor: Progressing normally Preeclampsia:  n/a Fetal Wellbeing:  Category I Pain Control:  Epidural I/D:  n/a Anticipated MOD:  NSVD  MUHAMMAD,Filicia Scogin 02/27/2012, 2:25 AM

## 2012-02-28 NOTE — Progress Notes (Signed)
Post Partum Day 1 Subjective: no complaints, up ad lib, voiding and tolerating PO; breastfeeding well; desires to stay one additional day; plans IUD for birth control.  Objective: Blood pressure 114/78, pulse 80, temperature 98 F (36.7 C), temperature source Oral, resp. rate 20, height 5\' 6"  (1.676 m), weight 71.215 kg (157 lb), last menstrual period 06/16/2011, SpO2 99.00%, unknown if currently breastfeeding.  Physical Exam:  General: alert, cooperative and appears stated age Lochia: appropriate Uterine Fundus: firm Incision: n/a DVT Evaluation: No evidence of DVT seen on physical exam. Negative Homan's sign.  Basename 02/27/12 0100  HGB 14.0  HCT 39.1    Assessment/Plan: Plan for discharge tomorrow   LOS: 2 days   [facility] 02/28/2012, 9:20 AM

## 2012-02-28 NOTE — Progress Notes (Signed)
SW received referral for history of anxiety/depression.  I went by patient's room and she had a room full of guests.  Plan is for SW follow up at next available opportunity.  Staci Acosta, MSW LCSW 02/28/2012 1:00 pm

## 2012-02-29 ENCOUNTER — Encounter: Payer: Medicaid Other | Admitting: Family

## 2012-02-29 MED ORDER — IBUPROFEN 600 MG PO TABS: 600.0000 mg | ORAL_TABLET | Freq: Four times a day (QID) | ORAL | Status: AC

## 2012-02-29 MED ORDER — OXYCODONE-ACETAMINOPHEN 5-325 MG PO TABS: 1.0000 | ORAL_TABLET | ORAL | Status: AC | PRN

## 2012-02-29 NOTE — Discharge Summary (Signed)
Obstetric Discharge Summary Reason for Admission: onset of labor Prenatal Procedures: none Intrapartum Procedures: spontaneous vaginal delivery Postpartum Procedures: none Complications-Operative and Postpartum: none Hemoglobin  Date Value Range Status  02/27/2012 14.0  12.0 - 15.0 g/dL Final     HCT  Date Value Range Status  02/27/2012 39.1  36.0 - 46.0 % Final    Physical Exam:  General: alert, cooperative and mild distress Heart: RRR Lungs: nl effort Lochia: appropriate Uterine Fundus: firm DVT Evaluation: No evidence of DVT seen on physical exam.  Discharge Diagnoses: Term Pregnancy-delivered  Discharge Information: Date: 02/29/2012 Activity: pelvic rest Diet: routine Medications: PNV, Ibuprofen and Percocet Condition: stable Instructions: refer to practice specific booklet Discharge to: home Follow-up Information    Follow up with [facility]. Schedule an appointment as soon as possible for a visit in 4 weeks.   Contact information:   [address] [place] [address]          Newborn Data: Live born female  Birth Weight: 7 lb 3.5 oz (3275 g) APGAR: 8, 9  Home with mother. Breastfeeding well; considering Mirena for contraception.  Cam Hai 02/29/2012, 6:39 AM

## 2012-02-29 NOTE — Discharge Instructions (Signed)

## 2012-03-04 NOTE — H&P (Signed)
Attestation of Attending Supervision of Advanced Practitioner: Evaluation and management procedures were performed by the PA/NP/CNM/OB Fellow under my supervision/collaboration. Chart reviewed and agree with management and plan.  Daijha Leggio V 03/04/2012 7:44 AM

## 2012-03-05 NOTE — Discharge Summary (Signed)
I have seen [name] @ and examination done.  I agree with documentation and plan as noted in resident/PA's note. Sydney Ryan V 03/05/2012 5:58 AM

## 2012-03-16 ENCOUNTER — Other Ambulatory Visit: Payer: Self-pay | Admitting: Family

## 2012-03-28 ENCOUNTER — Ambulatory Visit (INDEPENDENT_AMBULATORY_CARE_PROVIDER_SITE_OTHER): Payer: Medicaid Other | Admitting: Family

## 2012-03-28 ENCOUNTER — Encounter: Payer: Self-pay | Admitting: Family

## 2012-03-28 NOTE — Progress Notes (Signed)
  Subjective:     Sydney Ryan is a 26 y.o. female who presents for a postpartum visit. She is 4 weeks postpartum following a spontaneous vaginal delivery. I have fully reviewed the prenatal and intrapartum course. The delivery was at 40 gestational weeks. Outcome: spontaneous vaginal delivery. Anesthesia: epidural. Postpartum course has been unremarkable. Baby's course has been unremarkable. Baby is feeding by breast. Bleeding staining only. Bowel function is normal. Bladder function is normal. Patient is sexually active. Contraception method is condoms. Postpartum depression screening: negative.  The following portions of the patient's history were reviewed and updated as appropriate: allergies, current medications, past family history, past medical history, past social history, past surgical history and problem list.  Review of Systems Pertinent items are noted in HPI.   Objective:    BP 104/70  Pulse 63  Ht 5\' 6"  (1.676 m)  Wt 143 lb (64.864 kg)  BMI 23.08 kg/m2  Breastfeeding? Yes  General:  alert, cooperative and appears stated age   Breasts:  inspection negative, no nipple discharge or bleeding, no masses or nodularity palpable  Lungs: clear to auscultation bilaterally  Heart:  regular rate and rhythm, S1, S2 normal, no murmur, click, rub or gallop  Abdomen: soft, non-tender; bowel sounds normal; no masses,  no organomegaly   Vulva:  not evaluated  Vagina: not evaluated  Cervix:  Not examined  Corpus: normal size, contour, position, consistency, mobility, non-tender  Adnexa:  normal adnexa  Rectal Exam: Normal rectovaginal exam        Assessment:    Normal postpartum exam. Pap smear not done at today's visit.   History of High Grade Lesion.  Plan:    1. Contraception: condoms.  May decide on Mirena, will inform us at next visit. 2. Follow up in: 1 week for colposcopy.

## 2012-03-28 NOTE — Progress Notes (Signed)
Routine follow up.  Patient is doing well.

## 2012-04-13 ENCOUNTER — Telehealth: Payer: Self-pay | Admitting: *Deleted

## 2012-04-13 ENCOUNTER — Encounter: Payer: Medicaid Other | Admitting: Obstetrics & Gynecology

## 2012-04-13 NOTE — Telephone Encounter (Signed)
Pt called to reschedule her colposcopy for next week.  She was reminded of how important it is that she does keep the appt for the colpo.  She had an abnormal pap during her last pregnancy and never kept her Colpo appts.

## 2012-04-21 ENCOUNTER — Encounter: Payer: Medicaid Other | Admitting: Obstetrics & Gynecology

## 2012-04-26 ENCOUNTER — Encounter: Payer: Self-pay | Admitting: Obstetrics & Gynecology

## 2012-04-26 ENCOUNTER — Ambulatory Visit (INDEPENDENT_AMBULATORY_CARE_PROVIDER_SITE_OTHER): Payer: Medicaid Other | Admitting: Obstetrics & Gynecology

## 2012-04-26 VITALS — BP 114/75 | HR 66 | Temp 98.0°F | Resp 16 | Wt 142.0 lb

## 2012-04-26 DIAGNOSIS — R87613 High grade squamous intraepithelial lesion on cytologic smear of cervix (HGSIL): Secondary | ICD-10-CM

## 2012-04-26 DIAGNOSIS — Z01812 Encounter for preprocedural laboratory examination: Secondary | ICD-10-CM

## 2012-04-26 NOTE — Progress Notes (Signed)
Colposcopy Note  HGSIL pap smear on 11/30/11  Patient given informed consent, signed copy in the chart, time out was performed.  Placed in lithotomy position. Cervix viewed with speculum and colposcope after application of acetic acid.   Colposcopy adequate? Yes Lesions?Yes:  At 12, 7 and 9 o'clock. All lesions were acetowhite with mosaicism and punctation.  No abnormal vasculature. 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 ECC specimen obtained. All specimens were labelled and sent to pathology.  Patient was given post procedure instructions.  Will follow up pathology and manage accordingly.

## 2012-04-26 NOTE — Patient Instructions (Signed)
Colposcopy Care After Colposcopy is a procedure in which a special tool is used to magnify the surface of the cervix. A tissue sample (biopsy) may also be taken. This sample will be looked at for cervical cancer or other problems. After the test:  You may have some cramping.   Lie down for a few minutes if you feel lightheaded.    You may have some bleeding which should stop in a few days.  HOME CARE  Do not have sex or use tampons for 2 to 3 days or as told.   Only take medicine as told by your doctor.   Continue to take your birth control pills as usual.  Finding out the results of your test Ask when your test results will be ready. Make sure you get your test results. GET HELP RIGHT AWAY IF:  You are bleeding a lot or are passing blood clots.   You develop a fever of 102 F (38.9 C) or higher.   You have abnormal vaginal discharge.   You have cramps that do not go away with medicine.   You feel lightheaded, dizzy, or pass out (faint).  MAKE SURE YOU:   Understand these instructions.   Will watch your condition.   Will get help right away if you are not doing well or get worse.  Document Released: 02/03/2008 Document Revised: 08/06/2011 Document Reviewed: 02/03/2008 ExitCare Patient Information 2012 [employer]. 

## 2012-04-29 ENCOUNTER — Encounter: Payer: Self-pay | Admitting: Obstetrics & Gynecology

## 2012-04-29 ENCOUNTER — Telehealth: Payer: Self-pay | Admitting: *Deleted

## 2012-04-29 DIAGNOSIS — N871 Moderate cervical dysplasia: Secondary | ICD-10-CM | POA: Insufficient documentation

## 2012-04-29 NOTE — Telephone Encounter (Signed)
Pt notified of abnormal Colpo results and she needs appt to discuss LEEP VS Cryo.  She stated that she needed to talk with her husband and she would call back for an appt.  Encouraged pt that this needs to be addressed and she cannot just wish it away.

## 2014-07-02 ENCOUNTER — Encounter: Payer: Self-pay | Admitting: Obstetrics & Gynecology

## 2014-10-25 ENCOUNTER — Ambulatory Visit: Payer: Self-pay | Admitting: Obstetrics & Gynecology

## 2014-11-02 ENCOUNTER — Other Ambulatory Visit (HOSPITAL_COMMUNITY)
Admission: RE | Admit: 2014-11-02 | Discharge: 2014-11-02 | Disposition: A | Discharge status: Home / Self Care | Payer: Medicaid Other | Source: Ambulatory Visit | Attending: Family | Admitting: Family

## 2014-11-02 ENCOUNTER — Encounter: Payer: Self-pay | Admitting: Family

## 2014-11-02 ENCOUNTER — Ambulatory Visit (INDEPENDENT_AMBULATORY_CARE_PROVIDER_SITE_OTHER): Payer: Medicaid Other | Admitting: Family

## 2014-11-02 VITALS — BP 107/67 | HR 85 | Resp 16 | Ht 66.0 in | Wt 127.0 lb

## 2014-11-02 DIAGNOSIS — Z1151 Encounter for screening for human papillomavirus (HPV): Secondary | ICD-10-CM | POA: Insufficient documentation

## 2014-11-02 DIAGNOSIS — Z01411 Encounter for gynecological examination (general) (routine) with abnormal findings: Secondary | ICD-10-CM

## 2014-11-02 DIAGNOSIS — Z308 Encounter for other contraceptive management: Secondary | ICD-10-CM

## 2014-11-02 DIAGNOSIS — R87810 Cervical high risk human papillomavirus (HPV) DNA test positive: Secondary | ICD-10-CM | POA: Insufficient documentation

## 2014-11-02 DIAGNOSIS — R87613 High grade squamous intraepithelial lesion on cytologic smear of cervix (HGSIL): Secondary | ICD-10-CM

## 2014-11-02 DIAGNOSIS — Z8742 Personal history of other diseases of the female genital tract: Secondary | ICD-10-CM

## 2014-11-02 NOTE — Progress Notes (Signed)
  Subjective:     Sydney Ryan is a 29 y.o. female and is here for a comprehensive physical exam. Patient was last seen in 2013.  Reports continued intermittent left lower pelvic pain.  Pain is described as "sore".  Also reports abnormal vaginal bleeding.  Patient's last menstrual period was 10/26/2014.  No change in partner.  Uses condoms for birth control method.    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  Colposcopy performed in August 2013 showed moderate dysplasia.  Pt was advised to have either cryo or LEEP.  Pt did not keep appointment due to fear of pain.      History   Social History  . Marital Status: Married    Spouse Name: N/A  . Number of Children: N/A  . Years of Education: N/A   Occupational History  . marketing    Social History Main Topics  . Smoking status: Former Smoker -- 0.30 packs/day for 9 years    Types: Cigarettes  . Smokeless tobacco: Never Used  . Alcohol Use: Yes     Comment: wine x 2  . Drug Use: Yes  . Sexual Activity:    Partners: Male    Pharmacist, hospitalBirth Control/ Protection: None   Other Topics Concern  . Not on file   Social History Narrative   Health Maintenance  Topic Date Due  . HIV Screening  08/06/1999  . TETANUS/TDAP  08/06/2003  . INFLUENZA VACCINE  03/31/2014  . PAP SMEAR  11/30/2014   The following portions of the patient's history were reviewed and updated as appropriate: allergies, current medications, past family history, past medical history, past social history, past surgical history and problem list.  Review of Systems Pertinent items are noted in HPI.   Objective:     BP 107/67 mmHg  Pulse 85  Resp 16  Ht 5\' 6"  (1.676 m)  Wt 127 lb (57.607 kg)  BMI 20.51 kg/m2  LMP 10/26/2014  Breastfeeding? No General appearance: alert, cooperative and appears stated age Head: Normocephalic, without obvious abnormality, atraumatic Neck: no adenopathy, no carotid bruit, no JVD, supple, symmetrical, trachea midline and thyroid not  enlarged, symmetric, no tenderness/mass/nodules Lungs: clear to auscultation bilaterally Breasts: normal appearance, no masses or tenderness, No nipple retraction or dimpling, No nipple discharge or bleeding, No axillary or supraclavicular adenopathy, Normal to palpation without dominant masses, Taught monthly breast self examination Heart: regular rate and rhythm, S1, S2 normal, no murmur, click, rub or gallop Abdomen: soft, non-tender; bowel sounds normal; no masses,  no organomegaly Pelvic: cervix reddened areas on cervix at 12'oclock, 3 oclock and 9 oclock, external genitalia normal, no adnexal masses or tenderness, no cervical motion tenderness, rectovaginal septum normal, uterus normal size, shape, and consistency and vagina normal without discharge Skin: Skin color, texture, turgor normal. 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  Pt verbalizes understanding and agrees to plan suggested by Dr. Debroah LoopArnold (phone consult) to come in for colposcopy.  Pap smear sent to lab with HPV typing requested.    Eino FarberWalidah Kennith GainN Karim, CNM

## 2014-11-05 LAB — CYTOLOGY - PAP

## 2014-11-07 ENCOUNTER — Encounter: Payer: Medicaid Other | Admitting: Obstetrics & Gynecology

## 2014-11-08 ENCOUNTER — Ambulatory Visit (HOSPITAL_COMMUNITY)
Admission: RE | Admit: 2014-11-08 | Discharge: 2014-11-08 | Disposition: A | Discharge status: Home / Self Care | Payer: Medicaid Other | Source: Ambulatory Visit | Attending: Obstetrics and Gynecology | Admitting: Obstetrics and Gynecology

## 2014-11-08 ENCOUNTER — Encounter (HOSPITAL_COMMUNITY): Payer: Self-pay

## 2014-11-08 VITALS — BP 100/62 | Temp 97.7°F | Ht 66.0 in | Wt 131.0 lb

## 2014-11-08 DIAGNOSIS — Z1239 Encounter for other screening for malignant neoplasm of breast: Secondary | ICD-10-CM

## 2014-11-08 DIAGNOSIS — R87613 High grade squamous intraepithelial lesion on cytologic smear of cervix (HGSIL): Secondary | ICD-10-CM

## 2014-11-08 NOTE — Patient Instructions (Addendum)
Education materials on breast self awareness given. Explained to Delta Air Linesmanda N Pamer that she did not need a Pap smear today due to last Pap smear was 11/02/2014. [facility] to follow-up for abnormal Pap smear. Patient will call to schedule appointment. Encouraged her to call today or tomorrow to schedule appointment and explained [facility]. Informed patient she will need a screening mammogram at age 29 unless clinically indicated prior. Leonette Monarchmanda N Wittke verbalized understanding.  Ritha Sampedro, Kathaleen Maserhristine Poll, RN 3:50 PM

## 2014-11-08 NOTE — Progress Notes (Signed)
[facility] due to recommending a colpsocopy to follow-up for an abnormal Pap smear 11/02/2014.  Pap Smear:  Pap smear not completed today. Last Pap [facility]/02/2014 [facility] [place] and [facility]. [facility] for colposcopy to follow-up for abnormal Pap smear. Patient will call to schedule appointment. Patient has a history of two abnormal Pap smears in addition to her most recent abnormal Pap smear. Her first abnormal Pap smear was around 7-8 years ago and a colposcopy was completed for follow-up. Patient had an abnormal Pap smear 11/30/2011 that was HSIL that a colposcopy was completed for follow-up and a LEEP was recommended but patient never followed-up. Last two Pap smear results and last colposcopy result is in EPIC.  Physical exam: Breasts Breasts symmetrical. No skin abnormalities bilateral breasts. No nipple retraction bilateral breasts. No nipple discharge bilateral breasts. No lymphadenopathy. No lumps palpated bilateral breasts. No complaints of pain or tenderness on exam. Screening mammogram recommended at age 29 unless clinically indicated prior.  Pelvic/Bimanual No Pap smear completed today since last Pap smear was 11/02/2014. Pap smear not indicated per BCCCP guidelines.

## 2014-11-12 ENCOUNTER — Encounter: Payer: Self-pay | Admitting: Family

## 2014-11-12 ENCOUNTER — Encounter: Payer: Medicaid Other | Admitting: Obstetrics & Gynecology

## 2014-11-12 DIAGNOSIS — R87619 Unspecified abnormal cytological findings in specimens from cervix uteri: Secondary | ICD-10-CM | POA: Insufficient documentation

## 2014-11-12 NOTE — Progress Notes (Addendum)
Pt notified on 11/07/14 of abnormal pap smear result.  Appointment was scheduled with [facility] for follow-up and initiation of service with the program.  Patient colposcopy scheduled for 11/12/14.

## 2014-11-19 ENCOUNTER — Ambulatory Visit (INDEPENDENT_AMBULATORY_CARE_PROVIDER_SITE_OTHER): Payer: Self-pay | Admitting: Obstetrics & Gynecology

## 2014-11-19 ENCOUNTER — Encounter: Payer: Self-pay | Admitting: Obstetrics & Gynecology

## 2014-11-19 VITALS — BP 119/72 | HR 78 | Resp 16 | Ht 66.0 in | Wt 133.0 lb

## 2014-11-19 DIAGNOSIS — R87810 Cervical high risk human papillomavirus (HPV) DNA test positive: Secondary | ICD-10-CM

## 2014-11-19 DIAGNOSIS — R87613 High grade squamous intraepithelial lesion on cytologic smear of cervix (HGSIL): Secondary | ICD-10-CM

## 2014-11-19 DIAGNOSIS — Z01812 Encounter for preprocedural laboratory examination: Secondary | ICD-10-CM

## 2014-11-19 DIAGNOSIS — IMO0002 Reserved for concepts with insufficient information to code with codable children: Secondary | ICD-10-CM

## 2014-11-19 LAB — POCT URINE PREGNANCY: Preg Test, Ur: NEGATIVE

## 2014-11-19 NOTE — Progress Notes (Signed)
   Subjective:    Patient ID: Sydney IonAmanda N Ryan, female    DOB: 08/28/1986, 29 y.o.   MRN: [id number]  HPI [age] yo MW 76P1 (2 1/31 yo daughter) is here for a colpo. She had a HGSIL pap recently. 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 She is now ready to get treatment.   Review of Systems     Objective:   Physical Exam UPT negative, consent signed, time out done Cervix prepped with acetic acid. Transformation zone seen in its entirety. Colpo adequate. She had acetowhite change at the os and 2 small areas of changes c/w CIN2 (12 o'clock and 3 o'clock) ECC obtained. She tolerated the procedure well.         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 I have recommended a cryo if her ECC is negative.

## 2014-11-27 ENCOUNTER — Telehealth: Payer: Self-pay | Admitting: *Deleted

## 2014-11-27 NOTE — Telephone Encounter (Signed)
Pt notified of ECC and informed patient that she needs to make appt for cervical cryotherapy here in the office.

## 2014-12-03 ENCOUNTER — Ambulatory Visit (INDEPENDENT_AMBULATORY_CARE_PROVIDER_SITE_OTHER): Payer: Self-pay | Admitting: Obstetrics & Gynecology

## 2014-12-03 ENCOUNTER — Encounter: Payer: Self-pay | Admitting: Obstetrics & Gynecology

## 2014-12-03 VITALS — BP 108/72 | HR 84 | Resp 16 | Ht 66.0 in | Wt 127.0 lb

## 2014-12-03 DIAGNOSIS — R87613 High grade squamous intraepithelial lesion on cytologic smear of cervix (HGSIL): Secondary | ICD-10-CM

## 2014-12-03 DIAGNOSIS — Z01812 Encounter for preprocedural laboratory examination: Secondary | ICD-10-CM

## 2014-12-03 DIAGNOSIS — R87810 Cervical high risk human papillomavirus (HPV) DNA test positive: Secondary | ICD-10-CM

## 2014-12-03 LAB — POCT URINE PREGNANCY: Preg Test, Ur: NEGATIVE

## 2014-12-03 NOTE — Progress Notes (Signed)
   Subjective:    Patient ID: Sydney Ryan, female    DOB: 04/03/1986, 29 y.o.   MRN: [id number]  HPI She is here for cryo. Her ECC was negative.   Review of Systems     Objective:   Physical Exam  I applied vinegar and then used the large cryo probe for 2 1/2 minutes. I then used the smaller one for a minute. She tolerated the procedure well.     Assessment & Plan:  Cervical dysplasia- pap in 6 months

## 2015-01-03 ENCOUNTER — Ambulatory Visit: Payer: Medicaid Other | Admitting: Obstetrics & Gynecology

## 2015-01-08 ENCOUNTER — Encounter: Payer: Self-pay | Admitting: Obstetrics & Gynecology

## 2015-01-22 ENCOUNTER — Encounter: Payer: Self-pay | Admitting: Obstetrics & Gynecology

## 2016-03-12 ENCOUNTER — Other Ambulatory Visit (HOSPITAL_COMMUNITY)
Admission: RE | Admit: 2016-03-12 | Discharge: 2016-03-12 | Disposition: A | Discharge status: Home / Self Care | Payer: Medicaid Other | Source: Ambulatory Visit | Attending: Obstetrics & Gynecology | Admitting: Obstetrics & Gynecology

## 2016-03-12 ENCOUNTER — Ambulatory Visit (INDEPENDENT_AMBULATORY_CARE_PROVIDER_SITE_OTHER): Payer: Medicaid Other | Admitting: Obstetrics & Gynecology

## 2016-03-12 ENCOUNTER — Encounter: Payer: Self-pay | Admitting: Obstetrics & Gynecology

## 2016-03-12 VITALS — BP 96/61 | HR 74 | Resp 16 | Ht 65.0 in | Wt 130.0 lb

## 2016-03-12 DIAGNOSIS — Z124 Encounter for screening for malignant neoplasm of cervix: Secondary | ICD-10-CM

## 2016-03-12 DIAGNOSIS — Z01411 Encounter for gynecological examination (general) (routine) with abnormal findings: Secondary | ICD-10-CM | POA: Insufficient documentation

## 2016-03-12 DIAGNOSIS — Z1151 Encounter for screening for human papillomavirus (HPV): Secondary | ICD-10-CM | POA: Insufficient documentation

## 2016-03-12 DIAGNOSIS — Z01419 Encounter for gynecological examination (general) (routine) without abnormal findings: Secondary | ICD-10-CM | POA: Diagnosis not present

## 2016-03-12 DIAGNOSIS — Z30011 Encounter for initial prescription of contraceptive pills: Secondary | ICD-10-CM

## 2016-03-12 MED ORDER — NORETHINDRONE ACET-ETHINYL EST 1.5-30 MG-MCG PO TABS: 1.0000 | ORAL_TABLET | Freq: Every day | ORAL | Status: DC

## 2016-03-12 NOTE — Progress Notes (Signed)
Subjective:    Sydney Ryan is a 30 y.o. separated P3 (10, 7, and 32 yo kids)female who presents for an annual exam. The patient has no complaints today. The patient is sexually active. GYN screening history: last pap: was abnormal: CIN 2 s/p cryo. The patient wears seatbelts: yes. The patient participates in regular exercise: yes. Has the patient ever been transfused or tattooed?: yes. The patient reports that there is not domestic violence in her life.   Menstrual History: OB History    Gravida Para Term Preterm AB TAB SAB Ectopic Multiple Living   3 3 3       3       Menarche age: [age]  Patient's last menstrual period was 03/03/2016.    The following portions of the patient's history were reviewed and updated as appropriate: allergies, current medications, past family history, past medical history, past social history, past surgical history and problem list.  Review of Systems Pertinent items are noted in HPI.  No sex for several months, prior to that no new partners. Her husband has been diagnosed with HSV but she has not had any outbreaks. She has used OCPs in the past and would like them for menstrual regulation. Works for [employer] in [employer].   Objective:    BP 96/61 mmHg  Pulse 74  Resp 16  Ht 5\' 5"  (1.651 m)  Wt 130 lb (58.968 kg)  BMI 21.63 kg/m2  LMP 03/03/2016  General Appearance:    Alert, cooperative, no distress, appears stated age  Head:    Normocephalic, without obvious abnormality, atraumatic  Eyes:    PERRL, conjunctiva/corneas clear, EOM's intact, fundi    benign, both eyes  Ears:    Normal TM's and external ear canals, both ears  Nose:   Nares normal, septum midline, mucosa normal, no drainage    or sinus tenderness  Throat:   Lips, mucosa, and tongue normal; teeth and gums normal  Neck:   Supple, symmetrical, trachea midline, no adenopathy;    thyroid:  no enlargement/tenderness/nodules; no carotid   bruit or JVD  Back:      Symmetric, no curvature, ROM normal, no CVA tenderness  Lungs:     Clear to auscultation bilaterally, respirations unlabored  Chest Wall:    No tenderness or deformity   Heart:    Regular rate and rhythm, S1 and S2 normal, no murmur, rub   or gallop  Breast Exam:    No tenderness, masses, or nipple abnormality  Abdomen:     Soft, non-tender, bowel sounds active all four quadrants,    no masses, no organomegaly  Genitalia:    Normal female without lesion, discharge or tenderness, NSSR, NT, mobile, normal adnexal exam     Extremities:   Extremities normal, atraumatic, no cyanosis or edema  Pulses:   2+ and symmetric all extremities  Skin:   Skin color, texture, turgor normal, no rashes or lesions  Lymph nodes:   Cervical, supraclavicular, and axillary nodes normal  Neurologic:   CNII-XII intact, normal strength, sensation and reflexes    throughout  .    Assessment:    Healthy female exam.    Plan:     Thin prep Pap smear. with cotesting Menstrual regulation- lo estrin

## 2016-03-16 LAB — CYTOLOGY - PAP

## 2016-09-23 ENCOUNTER — Telehealth: Payer: Self-pay | Admitting: *Deleted

## 2016-09-23 DIAGNOSIS — Z30430 Encounter for insertion of intrauterine contraceptive device: Secondary | ICD-10-CM

## 2016-09-23 MED ORDER — MISOPROSTOL 200 MCG PO TABS: ORAL_TABLET | ORAL | 0 refills | Status: DC

## 2016-09-23 NOTE — Telephone Encounter (Signed)
Pt called to schedule IUD insert and per protocol Cytotec 400 mcg was sent to her pharmacy to take the night prior to procedure.

## 2016-10-14 ENCOUNTER — Encounter: Payer: Self-pay | Admitting: *Deleted

## 2016-10-14 ENCOUNTER — Ambulatory Visit (INDEPENDENT_AMBULATORY_CARE_PROVIDER_SITE_OTHER): Payer: Medicaid Other | Admitting: Obstetrics & Gynecology

## 2016-10-14 ENCOUNTER — Encounter: Payer: Self-pay | Admitting: Obstetrics & Gynecology

## 2016-10-14 VITALS — BP 110/69 | HR 75 | Resp 16 | Ht 65.0 in | Wt 132.0 lb

## 2016-10-14 DIAGNOSIS — Z30430 Encounter for insertion of intrauterine contraceptive device: Secondary | ICD-10-CM

## 2016-10-14 MED ORDER — LEVONORGESTREL 18.6 MCG/DAY IU IUD
INTRAUTERINE_SYSTEM | Freq: Once | INTRAUTERINE | Status: AC
  Administered 2016-10-14: 09:00:00 via INTRAUTERINE

## 2016-10-14 NOTE — Addendum Note (Signed)
Addended by: Granville LewisLARK, Tineshia Becraft L on: 10/14/2016 09:20 AM   Modules accepted: Orders

## 2016-10-14 NOTE — Progress Notes (Signed)
   Subjective:    Patient ID: Sydney Ryan, female    DOB: 02/06/1986, 31 y.o.   MRN: [id number]  HPI 31 yo MW lady here for Mirena insertion. She has been taking OCPs but sometimes forgets to take them. She is having her period today.   Review of Systems     Objective:   Physical Exam UPT negative, consent signed, Time out procedure done. Cervix prepped with betadine and grasped with a single tooth tenaculum. Liletta was easily placed and the strings were cut to 3-4 cm. Uterus sounded to 9 cm. She tolerated the procedure well.     Assessment & Plan:  Contraception- Liletta Rec back up method for 2 weeks

## 2016-11-20 ENCOUNTER — Ambulatory Visit: Payer: Medicaid Other | Admitting: Advanced Practice Midwife

## 2017-02-09 ENCOUNTER — Encounter (HOSPITAL_COMMUNITY): Payer: Self-pay | Admitting: *Deleted

## 2017-06-09 ENCOUNTER — Ambulatory Visit (INDEPENDENT_AMBULATORY_CARE_PROVIDER_SITE_OTHER): Payer: Medicaid Other | Admitting: Obstetrics and Gynecology

## 2017-06-09 ENCOUNTER — Other Ambulatory Visit (HOSPITAL_COMMUNITY)
Admission: RE | Admit: 2017-06-09 | Discharge: 2017-06-09 | Disposition: A | Discharge status: Home / Self Care | Payer: Medicaid Other | Source: Ambulatory Visit | Attending: Obstetrics and Gynecology | Admitting: Obstetrics and Gynecology

## 2017-06-09 ENCOUNTER — Encounter: Payer: Self-pay | Admitting: Obstetrics and Gynecology

## 2017-06-09 VITALS — BP 97/62 | HR 76 | Resp 16 | Ht 65.0 in | Wt 121.0 lb

## 2017-06-09 DIAGNOSIS — Z0000 Encounter for general adult medical examination without abnormal findings: Secondary | ICD-10-CM | POA: Diagnosis not present

## 2017-06-09 DIAGNOSIS — Z01419 Encounter for gynecological examination (general) (routine) without abnormal findings: Secondary | ICD-10-CM | POA: Insufficient documentation

## 2017-06-09 NOTE — Progress Notes (Signed)
Subjective:     Sydney Ryan is a 31 y.o. female P3 with BMI 20 who is here for a comprehensive physical exam. The patient reports some back pain related to kidney stone for which she is scheduled to see a urologist. She also reports 2 episodes of cyclic vomiting which required IV medication. Patient is otherwise without complaints. She is sexually active using Mirena IUD for contraception. She denies any vaginal bleeding or abnormal discharge. She denies pelvic pain  Past Medical History:  Diagnosis Date  . Abnormal Pap smear of cervix    LGSIL  . Anxiety    History reviewed. No pertinent surgical history. Family History  Problem Relation Age of Onset  . Hyperlipidemia Maternal Grandfather   . Diabetes Maternal Grandfather      Social History   Social History  . Marital status: Married    Spouse name: N/A  . Number of children: N/A  . Years of education: N/A   Occupational History  . marketing    Social History Main Topics  . Smoking status: Former Smoker    Packs/day: 0.30    Years: 9.00    Types: Cigarettes  . Smokeless tobacco: Never Used  . Alcohol use Yes     Comment: wine x 2  . Drug use: Yes  . Sexual activity: Yes    Partners: Male    Birth control/ protection: IUD   Other Topics Concern  . Not on file   Social History Narrative  . No narrative on file   Health Maintenance  Topic Date Due  . TETANUS/TDAP  08/06/2003  . INFLUENZA VACCINE  03/31/2017  . PAP SMEAR  03/13/2019  . HIV Screening  Completed       Review of Systems Pertinent items are noted in HPI.   Objective:  Blood pressure 97/62, pulse 76, resp. rate 16, height  (1.651 m), weight 121 lb (54.9 kg).    GENERAL: Well-developed, well-nourished female in no acute distress.  HEENT: Normocephalic, atraumatic. Sclerae anicteric.  NECK: Supple. Normal thyroid.  LUNGS: Clear to auscultation bilaterally.  HEART: Regular rate and rhythm. BREASTS: Symmetric in size. No palpable  masses or lymphadenopathy, skin changes, or nipple drainage. ABDOMEN: Soft, nontender, nondistended.  PELVIC: Normal external female genitalia. Vagina is pink and rugated.  Normal discharge. Normal appearing cervix with IUD strings extending 0.5 cm from os. Uterus is normal in size. No adnexal mass or tenderness. EXTREMITIES: No cyanosis, clubbing, or edema, 2+ distal pulses.  Assessment:    Healthy female exam.      Plan:     pap smear collected Patient will be contacted with abnormal results Patient advised to perform monthly self breast exam RTC in 1 year or prn See After Visit Summary for Counseling Recommendations

## 2017-06-14 LAB — CYTOLOGY - PAP
Diagnosis: NEGATIVE
HPV (WINDOPATH): NOT DETECTED

## 2017-08-02 NOTE — Progress Notes (Signed)
Err

## 2017-08-10 ENCOUNTER — Other Ambulatory Visit: Payer: Self-pay | Admitting: Obstetrics and Gynecology

## 2017-08-10 ENCOUNTER — Ambulatory Visit (INDEPENDENT_AMBULATORY_CARE_PROVIDER_SITE_OTHER): Payer: Medicaid Other | Admitting: Obstetrics and Gynecology

## 2017-08-10 ENCOUNTER — Encounter: Payer: Self-pay | Admitting: Obstetrics and Gynecology

## 2017-08-10 VITALS — BP 110/75 | HR 83 | Ht 66.0 in | Wt 129.0 lb

## 2017-08-10 DIAGNOSIS — N63 Unspecified lump in unspecified breast: Secondary | ICD-10-CM

## 2017-08-10 DIAGNOSIS — N6311 Unspecified lump in the right breast, upper outer quadrant: Secondary | ICD-10-CM

## 2017-08-10 DIAGNOSIS — N6321 Unspecified lump in the left breast, upper outer quadrant: Secondary | ICD-10-CM

## 2017-08-10 NOTE — Progress Notes (Signed)
[age] yo here for the evaluation of a left breast mass. Patient states that she felt it today. It is not painful. She is very worried about it  Past Medical History:  Diagnosis Date  . Abnormal Pap smear of cervix    LGSIL  . Anxiety    History reviewed. No pertinent surgical history. Family History  Problem Relation Age of Onset  . Hyperlipidemia Maternal Grandfather   . Diabetes Maternal Grandfather    Social History   Tobacco Use  . Smoking status: Former Smoker    Packs/day: 0.30    Years: 9.00    Pack years: 2.70    Types: Cigarettes  . Smokeless tobacco: Never Used  Substance Use Topics  . Alcohol use: Yes    Comment: wine x 2  . Drug use: Yes   ROS See pertinent in HPI  Blood pressure 110/75, pulse 83, height 5\' 6"  (1.676 m), weight 129 lb (58.5 kg). GENERAL: Well-developed, well-nourished female in no acute distress.  BREASTS: Symmetric in size. No palpable masses or lymphadenopathy, skin changes, or nipple drainage. Bilateral palpable agglomeration of tissue, both measuring approximately 2 cm wide located superior to the nipple and tracking under it. Very mobile, non tender  A/P 31 yo with bilateral breast masses - [facility] - Patient will be contacted with results

## 2017-08-10 NOTE — Progress Notes (Signed)
PT states that she has a lump on her left breast that she just found today.

## 2017-08-17 ENCOUNTER — Other Ambulatory Visit: Payer: Medicaid Other

## 2017-08-25 ENCOUNTER — Ambulatory Visit: Payer: Medicaid Other

## 2017-08-25 ENCOUNTER — Ambulatory Visit
Admission: RE | Admit: 2017-08-25 | Discharge: 2017-08-25 | Disposition: A | Discharge status: Home / Self Care | Payer: Medicaid Other | Source: Ambulatory Visit | Attending: Obstetrics and Gynecology | Admitting: Obstetrics and Gynecology

## 2017-08-25 DIAGNOSIS — N63 Unspecified lump in unspecified breast: Secondary | ICD-10-CM

## 2018-10-06 ENCOUNTER — Ambulatory Visit: Payer: Medicaid Other | Admitting: Obstetrics & Gynecology

## 2018-10-13 ENCOUNTER — Ambulatory Visit (INDEPENDENT_AMBULATORY_CARE_PROVIDER_SITE_OTHER): Payer: Medicaid Other | Admitting: Obstetrics & Gynecology

## 2018-10-13 ENCOUNTER — Other Ambulatory Visit (HOSPITAL_COMMUNITY)
Admission: RE | Admit: 2018-10-13 | Discharge: 2018-10-13 | Disposition: A | Discharge status: Home / Self Care | Payer: Medicaid Other | Source: Ambulatory Visit | Attending: Obstetrics & Gynecology | Admitting: Obstetrics & Gynecology

## 2018-10-13 ENCOUNTER — Encounter: Payer: Self-pay | Admitting: Obstetrics & Gynecology

## 2018-10-13 VITALS — BP 120/64 | HR 77 | Resp 16 | Ht 66.0 in | Wt 130.0 lb

## 2018-10-13 DIAGNOSIS — Z0000 Encounter for general adult medical examination without abnormal findings: Secondary | ICD-10-CM | POA: Diagnosis not present

## 2018-10-13 DIAGNOSIS — Z01419 Encounter for gynecological examination (general) (routine) without abnormal findings: Secondary | ICD-10-CM | POA: Insufficient documentation

## 2018-10-13 NOTE — Progress Notes (Signed)
Subjective:    Sydney Ryan is a 33 y.o.divorced P3 (12, 10, and 30 yo kids)  female who presents for an annual exam. The patient has no complaints today. She would like a pregnancy test. She has occasional spotting with the Mirena IUD. The patient is sexually active. GYN screening history: last pap: was normal. The patient wears seatbelts: yes. 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 Has the patient ever been transfused or tattooed?: yes. The patient reports that there is not domestic violence in her life.   Menstrual History: OB History    Gravida  3   Para  3   Term  3   Preterm      AB      Living  3     SAB      TAB      Ectopic      Multiple      Live Births  3           Menarche age: 21 No LMP recorded. (Menstrual status: IUD).    The following portions of the patient's history were reviewed and updated as appropriate: allergies, current medications, past family history, past medical history, past social history, past surgical history and problem list.  Review of Systems Pertinent items are noted in HPI.   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    Objective:    BP 120/64   Pulse 77   Resp 16   Ht 5\' 6"  (1.676 m)   Wt 130 lb (59 kg)   BMI 20.98 kg/m   General Appearance:    Alert, cooperative, no distress, appears stated age  Head:    Normocephalic, without obvious abnormality, atraumatic  Eyes:    PERRL, conjunctiva/corneas clear, EOM's intact, fundi    benign, both eyes  Ears:    Normal TM's and external ear canals, both ears  Nose:   Nares normal, septum midline, mucosa normal, no drainage    or sinus tenderness  Throat:   Lips, mucosa, and tongue normal; teeth and gums normal  Neck:   Supple, symmetrical, trachea midline, no adenopathy;    thyroid:  no enlargement/tenderness/nodules; no carotid   bruit or JVD   Back:     Symmetric, no curvature, ROM normal, no CVA tenderness  Lungs:     Clear to auscultation bilaterally, respirations unlabored  Chest Wall:    No tenderness or deformity   Heart:    Regular rate and rhythm, S1 and S2 normal, no murmur, rub   or gallop  Breast Exam:    No tenderness, masses, or nipple abnormality  Abdomen:     Soft, non-tender, bowel sounds active all four quadrants,    no masses, no organomegaly  Genitalia:    Normal female without lesion, discharge or tenderness, IUD string very short and seen as os, old blood noted. normal size and shape, anteverted, mobile, non-tender, normal adnexal exam      Extremities:   Extremities normal, atraumatic, no cyanosis or edema  Pulses:   2+ and symmetric all extremities  Skin:   Skin color, texture, turgor normal, no rashes or lesions  Lymph nodes:   Cervical, supraclavicular, and axillary nodes normal  Neurologic:   CNII-XII intact, normal strength, sensation and reflexes    throughout  .    Assessment:    Healthy female exam.    Plan:  Thin prep Pap smear. with cotesting Fasting labs at her convenience

## 2018-10-17 LAB — CYTOLOGY - PAP
Chlamydia: NEGATIVE
Diagnosis: NEGATIVE
HPV: NOT DETECTED
Neisseria Gonorrhea: NEGATIVE

## 2018-11-01 ENCOUNTER — Ambulatory Visit: Payer: Self-pay | Admitting: Certified Nurse Midwife

## 2018-11-01 ENCOUNTER — Encounter: Payer: Self-pay | Admitting: Certified Nurse Midwife

## 2018-11-01 VITALS — BP 120/64 | HR 77 | Resp 16 | Ht 66.0 in | Wt 130.0 lb

## 2018-11-01 DIAGNOSIS — N6320 Unspecified lump in the left breast, unspecified quadrant: Secondary | ICD-10-CM

## 2018-11-01 NOTE — Progress Notes (Addendum)
History:  [name] is a 33 y.o. [phone] who presents to clinic today for a lump in her left breast that she noticed yesterday evening.  The following portions of the patient's history were reviewed and updated as appropriate: allergies, current medications, family history, past medical history, social history, past surgical history and problem list.  Review of Systems:  Review of Systems  Constitutional: Negative.   Eyes: Negative.   Respiratory: Negative.   Cardiovascular: Negative.   Gastrointestinal: Negative.   Genitourinary: Negative.   Musculoskeletal: Negative.   Neurological: Negative.   Psychiatric/Behavioral: Negative.   Positive for breast lump, negative for pain or tenderness     Objective:  Physical Exam BP 120/64   Pulse 77   Resp 16   Ht 5\' 6"  (1.676 m)   Wt 130 lb (59 kg)   BMI 20.98 kg/m  Physical Exam Constitutional:      Appearance: Normal appearance. She is normal weight.  HENT:     Head: Normocephalic and atraumatic.     Nose: Nose normal.     Mouth/Throat:     Mouth: Mucous membranes are moist.  Neck:     Musculoskeletal: Normal range of motion and neck supple.  Cardiovascular:     Rate and Rhythm: Normal rate and regular rhythm.     Pulses: Normal pulses.     Heart sounds: Normal heart sounds.  Pulmonary:     Effort: Pulmonary effort is normal.     Breath sounds: Normal breath sounds.  Chest:     Breasts:        Right: No swelling, bleeding, inverted nipple, mass, nipple discharge, skin change or tenderness.        Left: Mass present. No swelling, bleeding, inverted nipple, nipple discharge, skin change or tenderness.     Comments: Breast tissue symmetrical with free movement.  Nontender area of dense tissue approximately 2-3cm noted medially above the left nipple and extending down the medial side of the areola about 1 cm. Abdominal:     General: Abdomen is flat. Bowel sounds are normal.     Palpations: Abdomen is soft.   Musculoskeletal: Normal range of motion.  Skin:    General: Skin is warm and dry.     Capillary Refill: Capillary refill takes less than 2 seconds.  Neurological:     General: No focal deficit present.     Mental Status: She is alert and oriented to person, place, and time.  Psychiatric:        Mood and Affect: Mood normal.        Behavior: Behavior normal.        Thought Content: Thought content normal.        Judgment: Judgment normal.    Assessment & Plan:  1. Lump of left breast Order sent for breast ultrasound. - MM DIAG BREAST TOMO UNI LEFT; Future  Louis Matte. Earlene Plater, SNP 11/01/2018 2:04 PM

## 2018-11-02 ENCOUNTER — Telehealth (HOSPITAL_COMMUNITY): Payer: Self-pay

## 2018-11-02 ENCOUNTER — Encounter: Payer: Self-pay | Admitting: Certified Nurse Midwife

## 2018-11-02 NOTE — Telephone Encounter (Signed)
Left message with patient about getting her scheduled through the BCCCP program to get her mammo covered.

## 2018-11-18 ENCOUNTER — Other Ambulatory Visit: Payer: Self-pay | Admitting: Certified Nurse Midwife

## 2018-11-18 DIAGNOSIS — N6320 Unspecified lump in the left breast, unspecified quadrant: Secondary | ICD-10-CM

## 2019-07-20 ENCOUNTER — Ambulatory Visit: Payer: Medicaid Other | Admitting: Obstetrics & Gynecology

## 2019-10-17 ENCOUNTER — Encounter: Payer: Self-pay | Admitting: Nurse Practitioner

## 2019-10-18 ENCOUNTER — Encounter: Payer: Self-pay | Admitting: Nurse Practitioner

## 2019-10-18 ENCOUNTER — Telehealth: Payer: Self-pay

## 2019-10-18 ENCOUNTER — Other Ambulatory Visit (HOSPITAL_COMMUNITY)
Admission: RE | Admit: 2019-10-18 | Discharge: 2019-10-18 | Disposition: A | Discharge status: Home / Self Care | Payer: Medicaid Other | Source: Ambulatory Visit | Attending: Nurse Practitioner | Admitting: Nurse Practitioner

## 2019-10-18 ENCOUNTER — Ambulatory Visit (INDEPENDENT_AMBULATORY_CARE_PROVIDER_SITE_OTHER): Payer: Medicaid Other | Admitting: Nurse Practitioner

## 2019-10-18 ENCOUNTER — Other Ambulatory Visit: Payer: Self-pay

## 2019-10-18 VITALS — BP 108/72 | HR 78 | Ht 66.0 in | Wt 147.0 lb

## 2019-10-18 DIAGNOSIS — N6310 Unspecified lump in the right breast, unspecified quadrant: Secondary | ICD-10-CM

## 2019-10-18 DIAGNOSIS — Z01419 Encounter for gynecological examination (general) (routine) without abnormal findings: Secondary | ICD-10-CM | POA: Insufficient documentation

## 2019-10-18 DIAGNOSIS — Z87410 Personal history of cervical dysplasia: Secondary | ICD-10-CM

## 2019-10-18 DIAGNOSIS — Z0000 Encounter for general adult medical examination without abnormal findings: Secondary | ICD-10-CM | POA: Diagnosis not present

## 2019-10-18 NOTE — Telephone Encounter (Signed)
Telephoned patient at home number. Left a voice message for patient to call BCCCP and schedule appointment.

## 2019-10-18 NOTE — Progress Notes (Signed)
GYNECOLOGY ANNUAL PREVENTATIVE CARE ENCOUNTER NOTE  Subjective:   Sydney Ryan is a 34 y.o. [id number] female here for a routine annual gynecologic exam.  Current complaints: Still has periodic breast pain - thinks it is related to IUD and likely when she has her menses - only spotting. .   Denies abnormal vaginal bleeding, discharge, pelvic pain, problems with intercourse or other gynecologic concerns.    Gynecologic History No LMP recorded. (Menstrual status: IUD). Contraception: IUD inserted in 2018 Last Pap: 2020 . Results were: normal Last mammogram: 2018. Results were: normal  Obstetric History OB History  Gravida Para Term Preterm AB Living  3 3 3     3   SAB TAB Ectopic Multiple Live Births          3    # Outcome Date GA Lbr Len/2nd Weight Sex Delivery Anes PTL Lv  3 Term 02/27/12 [redacted]w[redacted]d -17:00 / 00:22 7 lb 3.5 oz (3.275 kg) F Vag-Spont EPI  LIV  2 Term 09/06/08 [redacted]w[redacted]d  8 lb 8 oz (3.856 kg) M Vag-Spont EPI N LIV     Birth Comments: bilat periurethral lac  1 Term 01/17/06 [redacted]w[redacted]d  8 lb 2 oz (3.685 kg) M Vag-Spont EPI N LIV    Past Medical History:  Diagnosis Date  . Abnormal Pap smear of cervix    LGSIL  . Anxiety     History reviewed. No pertinent surgical history.  Current Outpatient Medications on File Prior to Visit  Medication Sig Dispense Refill  . levonorgestrel (MIRENA) 20 MCG/24HR IUD 1 each by Intrauterine route once.    Marland Kitchen LORazepam (ATIVAN) 0.5 MG tablet Take by mouth.     No current facility-administered medications on file prior to visit.    No Known Allergies  Social History   Socioeconomic History  . Marital status: Married    Spouse name: Not on file  . Number of children: Not on file  . Years of education: Not on file  . Highest education level: Not on file  Occupational History  . Occupation: Pharmacologist  Tobacco Use  . Smoking status: Former Smoker    Packs/day: 0.30    Years: 9.00    Pack years: 2.70    Types: Cigarettes  .  Smokeless tobacco: Never Used  Substance and Sexual Activity  . Alcohol use: Yes    Comment: wine x 2  . Drug use: Yes  . Sexual activity: Yes    Partners: Male    Birth control/protection: I.U.D.  Other Topics Concern  . Not on file  Social History Narrative  . Not on file   Social Determinants of Health   Financial Resource Strain:   . Difficulty of Paying Living Expenses: Not on file  Food Insecurity:   . Worried About Charity fundraiser in the Last Year: Not on file  . Ran Out of Food in the Last Year: Not on file  Transportation Needs:   . Lack of Transportation (Medical): Not on file  . Lack of Transportation (Non-Medical): Not on file  Physical Activity:   . Days of Exercise per Week: Not on file  . Minutes of Exercise per Session: Not on file  Stress:   . Feeling of Stress : Not on file  Social Connections:   . Frequency of Communication with Friends and Family: Not on file  . Frequency of Social Gatherings with Friends and Family: Not on file  . Attends Religious Services: Not on file  .  Active Member of Clubs or Organizations: Not on file  . Attends Banker Meetings: Not on file  . Marital Status: Not on file  Intimate Partner Violence:   . Fear of Current or Ex-Partner: Not on file  . Emotionally Abused: Not on file  . Physically Abused: Not on file  . Sexually Abused: Not on file    Family History  Problem Relation Age of Onset  . Hyperlipidemia Maternal Grandfather   . Diabetes Maternal Grandfather     The following portions of the patient's history were reviewed and updated as appropriate: allergies, current medications, past family history, past medical history, past social history, past surgical history and problem list.  Review of Systems Pertinent items noted in HPI and remainder of comprehensive ROS otherwise negative.   Objective:  BP 108/72   Pulse 78   Ht 5\' 6"  (1.676 m)   Wt 147 lb (66.7 kg)   BMI 23.73 kg/m    CONSTITUTIONAL: Well-developed, well-nourished female in no acute distress.  HENT:  Normocephalic, atraumatic, External right and left ear normal.  EYES: Conjunctivae and EOM are normal. Pupils are equal, round.  No scleral icterus.  NECK: Normal range of motion, supple, no masses.  Normal thyroid.  SKIN: Skin is warm and dry. No rash noted. Not diaphoretic. No erythema. No pallor. NEUROLOGIC: Alert and oriented to person, place, and time. Normal reflexes, muscle tone coordination. No cranial nerve deficit noted. PSYCHIATRIC: Normal mood and affect. Normal behavior. Normal judgment and thought content. CARDIOVASCULAR: Normal heart rate noted, regular rhythm RESPIRATORY: Clear to auscultation bilaterally. Effort and breath sounds normal, no problems with respiration noted. BREASTS: Symmetric in size. Right thickening and prominent tissue - mass in right upper outer quadrant, 2 cm x 3.5 cm, no skin changes, nipple drainage, or lymphadenopathy.  Has tenderness from time to time in both breasts.  No particular tenderness today. ABDOMEN: Soft, no distention noted.  No tenderness, rebound or guarding.  PELVIC: Normal appearing external genitalia; normal appearing vaginal mucosa and cervix.  No abnormal discharge noted.  Pap smear obtained.  Normal uterine size, no other palpable masses, no uterine or adnexal tenderness. MUSCULOSKELETAL: Normal range of motion. No tenderness.  No cyanosis, clubbing, or edema.    Assessment and Plan:  1. Well woman exam with routine gynecological exam Appropriate weight BP normal  2.  Breast mass right Right breast in riht upper outer quadrant.  Fibrous normal tissue vs.  Breast mass.  Advised to have breast ultrasound especially due to continuing breast pain.  No insurance.  Will refer to BCCEP  3.  History of Cervical dysplasia with cryo in 2018 Pap done  Will follow up results of pap smear and manage accordingly. Routine preventative health maintenance  measures emphasized. Please refer to After Visit Summary for other counseling recommendations.    2019, RN, MSN, NP-BC Nurse Practitioner, [facility] for [facility]

## 2019-10-19 LAB — CYTOLOGY - PAP
Comment: NEGATIVE
Diagnosis: NEGATIVE
High risk HPV: NEGATIVE

## 2020-01-09 ENCOUNTER — Telehealth: Payer: Self-pay

## 2020-01-09 NOTE — Telephone Encounter (Signed)
Telephoned patient at home number. Patient feels at this time BCCCP services are not needed.

## 2020-06-03 ENCOUNTER — Other Ambulatory Visit: Payer: Self-pay

## 2020-06-03 DIAGNOSIS — N63 Unspecified lump in unspecified breast: Secondary | ICD-10-CM

## 2020-06-18 ENCOUNTER — Ambulatory Visit
Admission: RE | Admit: 2020-06-18 | Discharge: 2020-06-18 | Disposition: A | Discharge status: Home / Self Care | Payer: Medicaid Other | Source: Ambulatory Visit | Attending: Obstetrics and Gynecology | Admitting: Obstetrics and Gynecology

## 2020-06-18 ENCOUNTER — Other Ambulatory Visit: Payer: Self-pay

## 2020-06-18 ENCOUNTER — Ambulatory Visit
Admission: RE | Admit: 2020-06-18 | Discharge: 2020-06-18 | Disposition: A | Discharge status: Home / Self Care | Payer: No Typology Code available for payment source | Source: Ambulatory Visit | Attending: Obstetrics and Gynecology | Admitting: Obstetrics and Gynecology

## 2020-06-18 ENCOUNTER — Ambulatory Visit: Payer: Self-pay | Admitting: *Deleted

## 2020-06-18 ENCOUNTER — Other Ambulatory Visit: Payer: Self-pay | Admitting: Obstetrics and Gynecology

## 2020-06-18 VITALS — BP 100/60 | Temp 98.2°F | Wt 136.7 lb

## 2020-06-18 DIAGNOSIS — N63 Unspecified lump in unspecified breast: Secondary | ICD-10-CM

## 2020-06-18 DIAGNOSIS — N6325 Unspecified lump in the left breast, overlapping quadrants: Secondary | ICD-10-CM

## 2020-06-18 DIAGNOSIS — N644 Mastodynia: Secondary | ICD-10-CM

## 2020-06-18 DIAGNOSIS — N6311 Unspecified lump in the right breast, upper outer quadrant: Secondary | ICD-10-CM

## 2020-06-18 DIAGNOSIS — Z1239 Encounter for other screening for malignant neoplasm of breast: Secondary | ICD-10-CM

## 2020-06-18 DIAGNOSIS — N6322 Unspecified lump in the left breast, upper inner quadrant: Secondary | ICD-10-CM

## 2020-06-18 IMAGING — US US BREAST*R* LIMITED INC AXILLA
1 series · 12 of 12 positions shown · non-contrast
Comparison: [DATE]

CLINICAL DATA: Palpable abnormalities in both breasts.

EXAM:
DIGITAL DIAGNOSTIC BILATERAL MAMMOGRAM WITH CAD AND TOMO
ULTRASOUND BILATERAL BREAST

[Series 1: us breast*right* limited inc axilla · 0.06mm/px · 12 of 12 slices shown]
[im 1/12]
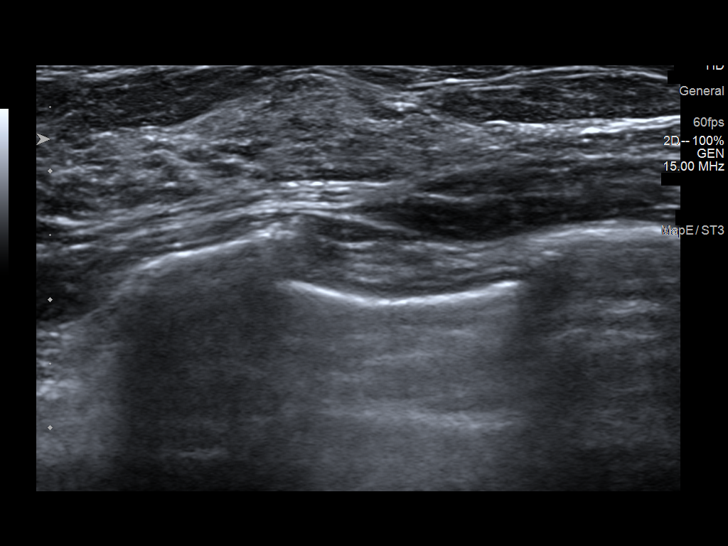
[im 2/12]
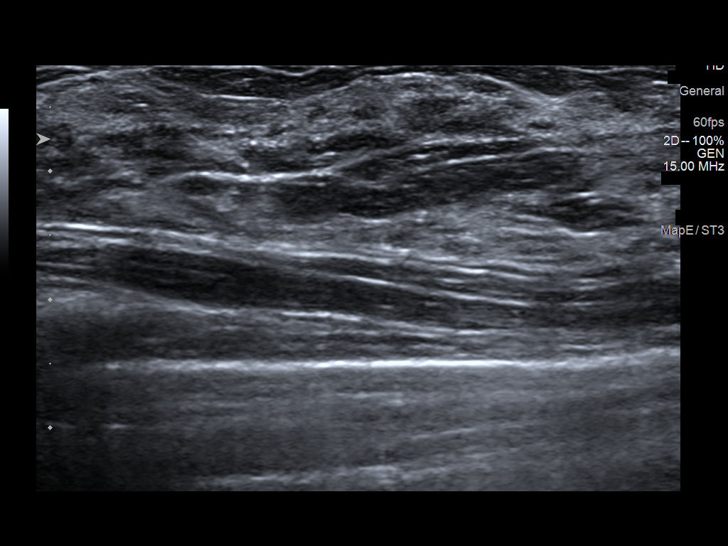
[im 3/12]
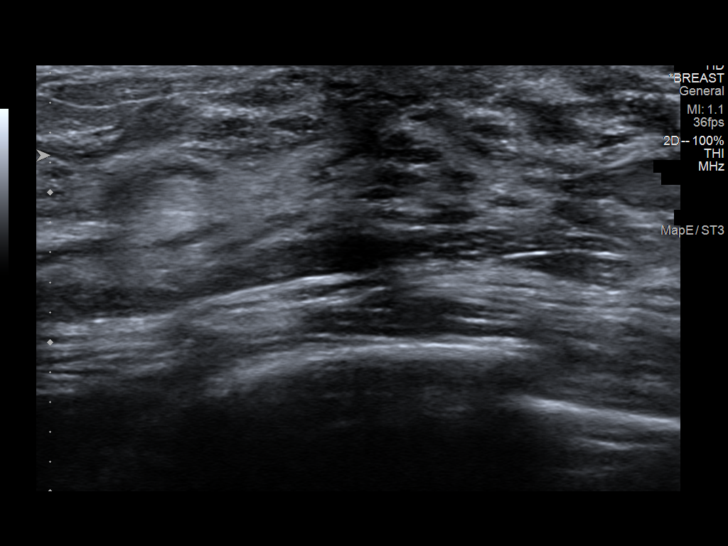
[im 4/12]
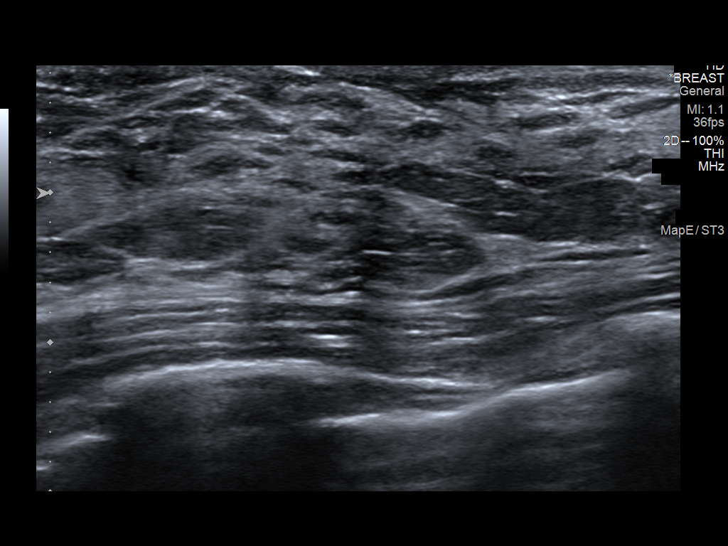
[im 5/12]
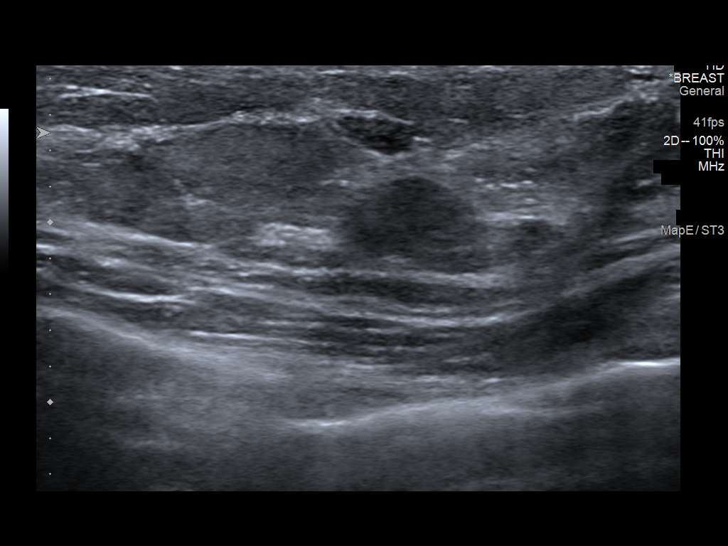
[im 6/12]
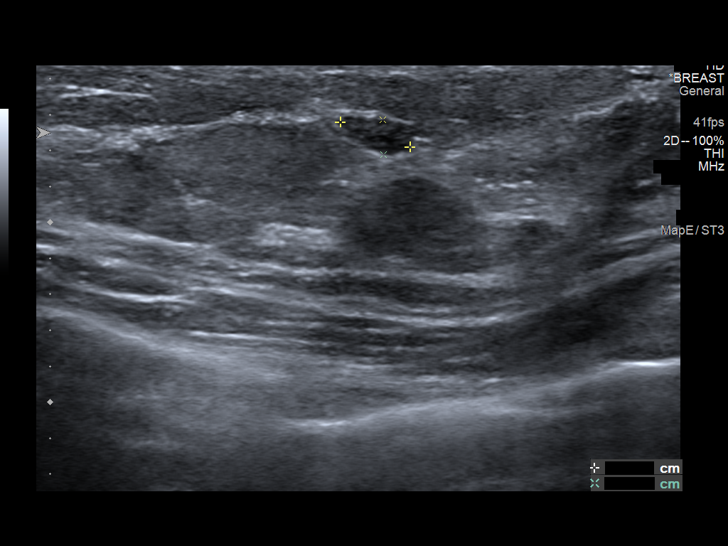
[im 7/12]
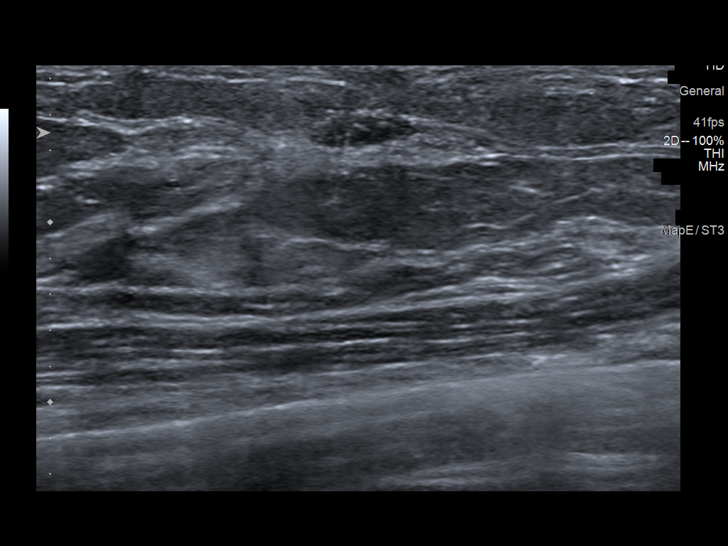
[im 8/12]
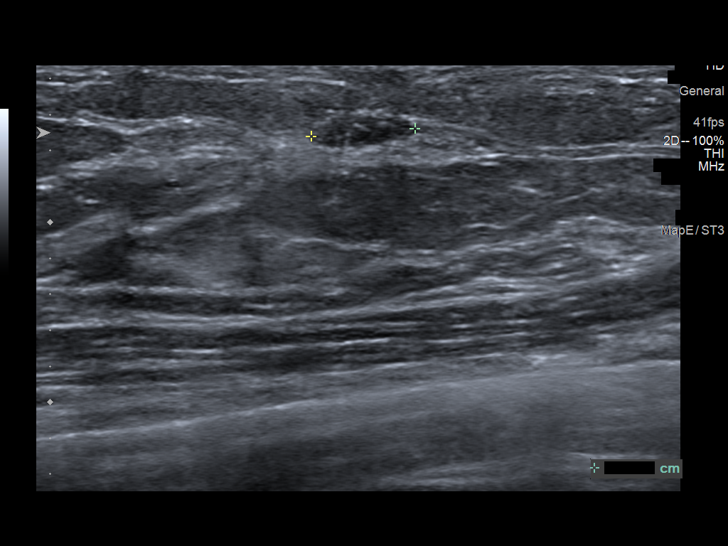
[im 9/12]
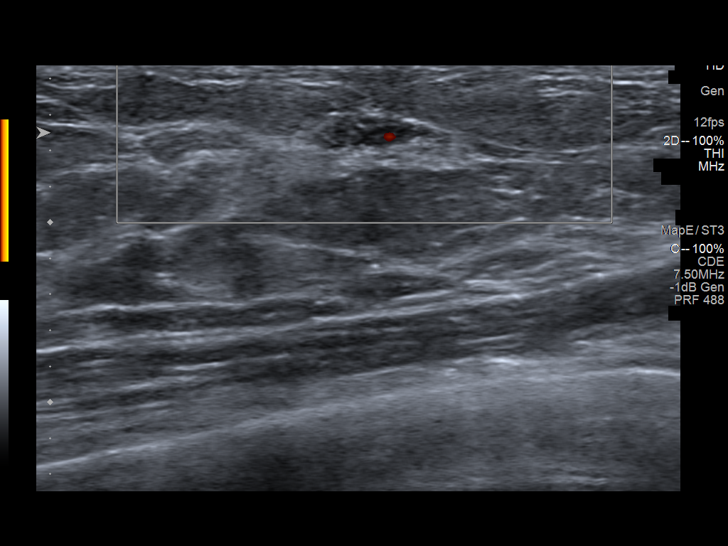
[im 10/12]
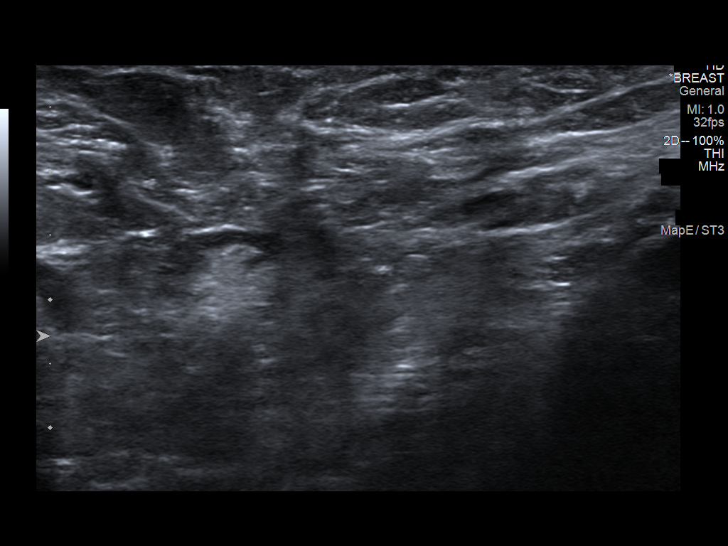
[im 11/12]
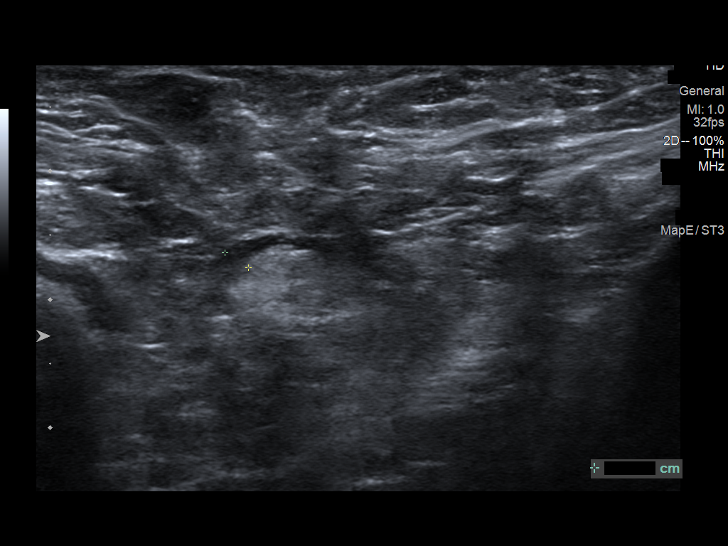
[im 12/12]
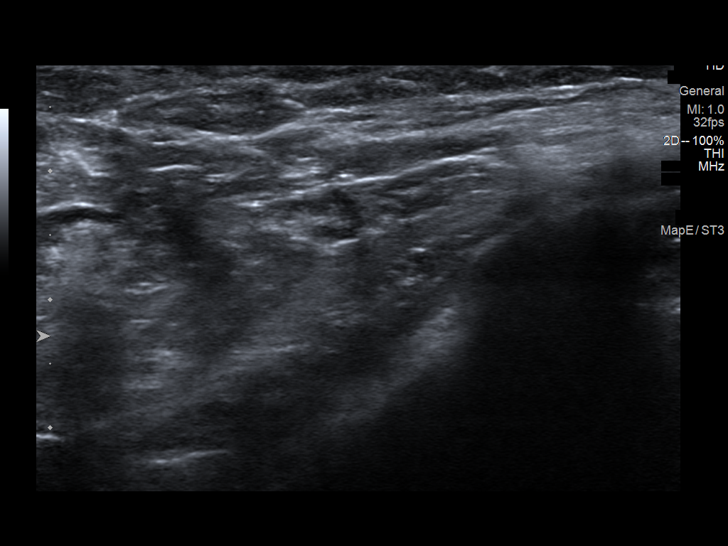

[12 of 12 positions shown; findings below may reference images not displayed]

ACR Breast Density Category c: The breast tissue is heterogeneously
dense, which may obscure small masses.
FINDINGS: RIGHT BREAST:

Mammogram: Spot tangential view is performed in the UPPER-OUTER
QUADRANT of the RIGHT breast, showing dense fibroglandular tissue.
Focal asymmetry in the MEDIAL portion of the RIGHT breast is
confirmed on spot compression views. Mammographic images were
processed with CAD.

Ultrasound: Targeted ultrasound is performed, showing normal
appearing dense fibroglandular tissue in the 10 o'clock location of
the RIGHT breast and retroareolar region of the RIGHT breast.

In the 4 o'clock location of the RIGHT breast 4 centimeters from the
nipple, an oval mass with indistinct margins is 0.4 x 0.2 x
centimeters. Internal blood flow is present on Doppler evaluation.

Evaluation of the RIGHT axilla is negative for adenopathy.

LEFT BREAST:

Mammogram: Dense fibroglandular tissue is identified in the central
portion of the LEFT breast, in the area of palpable abnormalities.
Mammographic images were processed with CAD.

Physical Exam: I palpate discrete focal thickening in the 10 o'clock
location of the LEFT breast.

Ultrasound: Targeted ultrasound is performed, showing extremely
dense fibroglandular tissue in the 3 o'clock and 10 o'clock
locations of the LEFT breast, in the area of patient's concern. No
suspicious mass, distortion, or acoustic shadowing is demonstrated
with ultrasound.
IMPRESSION: 1. Indeterminate mass in the 4 o'clock location of the RIGHT breast
warranting tissue diagnosis.
2. No axillary adenopathy.
3. Other areas of palpable abnormalities correspond to dense
fibroglandular tissue bilaterally.

RECOMMENDATION:
Recommend ultrasound-guided core biopsy of mass in the 4 o'clock
location of the RIGHT breast. Following biopsy, recommend close
correlation with clip images and the mammographic finding to
determine if these are one in the same.

I have discussed the findings and recommendations with the patient.
If applicable, a reminder letter will be sent to the patient
regarding the next appointment.

BI-RADS CATEGORY  4: Suspicious.

## 2020-06-18 NOTE — Progress Notes (Signed)
[name] is a 34 y.o. female who presents to [facility] clinic today with complaint of left breast lump x 6 months and right breast lump x 2-3 weeks. Patient complained of mild left breast pain that comes and goes. Patient rates the pain at a 2-3 out of 10.    Pap Smear: Pap not smear completed today. Last Pap smear was 10/18/2019 at [facility] in [place] clinic and was normal with negative HPV. 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 Patients Pap smears 10/14/2019, 06/09/2017, and 03/12/2016 were all normal with negative HPV. Last six Pap smear and previous two colposcopies results is available in Epic.  Physical exam: Breasts Breasts symmetrical. No skin abnormalities bilateral breasts. No nipple retraction bilateral breasts. No nipple discharge bilateral breasts. No lymphadenopathy. Palpated a mobile lump within the right breast at 10 o'clock 5 cm from the nipple. Palpated two lumps within the left breast a mobile lump at 11 o'clock 2 cm from the nipple and a non-mobile lump at 3 o'clock next to areola. Complaints of bilateral outer breast tenderness on exam.    Pelvic/Bimanual Pap is not indicated today per BCCCP guidelines.   Smoking History: Patient has is a former smoker that quit 3 years ago and starting vaping. Discussed smoking cessation with patient. Referred to the [facility] Quitline and gave resources to the free smoking cessation classes at [facility].   Patient Navigation: Patient education provided. Access to services provided for patient through BCCCP program.    Breast and Cervical Cancer Risk  Assessment: Patient does not have family history of breast cancer, known genetic mutations, or radiation treatment to the chest before age 66. Patient has history of cervical dysplasia. Patient has no history of being immunocompromised or DES exposure in-utero.  Risk Assessment    Risk Scores      06/18/2020   Last edited by: Narda Rutherford, LPN   5-year risk: 0.3 %   Lifetime risk: 9.2 %          A: BCCCP exam without pap smear Complaint of bilateral breast lumps.  P: Referred patient to the [facility] for a diagnostic mammogram. Appointment scheduled Tuesday, June 18, 2020 at 1520.  Priscille Heidelberg, RN 06/18/2020 9:32 AM

## 2020-06-18 NOTE — Patient Instructions (Signed)
Explained breast self awareness with Leonette Monarch Cassedy. Patient did not need a Pap smear today due to last Pap smear and HPV typing was 10/18/2019. Let patient know that due to her history of abnormal Pap smears that she will need to consult with her provider on when the next Pap smear is recommended. Referred patient to the [facility] for a diagnostic mammogram. Appointment scheduled Tuesday, June 18, 2020 at 1520. Patient aware of appointment and will be there. Carmen Tolliver Rayson verbalized understanding.  Valton Schwartz, Kathaleen Maser, RN 9:32 AM

## 2020-06-26 ENCOUNTER — Ambulatory Visit
Admission: RE | Admit: 2020-06-26 | Discharge: 2020-06-26 | Disposition: A | Discharge status: Home / Self Care | Payer: No Typology Code available for payment source | Source: Ambulatory Visit | Attending: Obstetrics and Gynecology | Admitting: Obstetrics and Gynecology

## 2020-06-26 ENCOUNTER — Other Ambulatory Visit: Payer: Self-pay

## 2020-06-26 DIAGNOSIS — N63 Unspecified lump in unspecified breast: Secondary | ICD-10-CM

## 2020-12-16 ENCOUNTER — Other Ambulatory Visit: Payer: Self-pay | Admitting: Obstetrics and Gynecology

## 2020-12-16 DIAGNOSIS — N63 Unspecified lump in unspecified breast: Secondary | ICD-10-CM

## 2021-01-03 ENCOUNTER — Ambulatory Visit: Payer: No Typology Code available for payment source | Admitting: Obstetrics and Gynecology

## 2021-01-21 ENCOUNTER — Ambulatory Visit: Payer: No Typology Code available for payment source | Admitting: Obstetrics and Gynecology

## 2021-02-10 ENCOUNTER — Telehealth: Payer: Self-pay | Admitting: *Deleted

## 2021-02-10 NOTE — Telephone Encounter (Signed)
Left patient an urgent message to call the office prior to her appointment arrival time of 2:15 PM.

## 2021-02-11 ENCOUNTER — Other Ambulatory Visit: Payer: Self-pay

## 2021-02-11 ENCOUNTER — Encounter: Payer: Self-pay | Admitting: Obstetrics and Gynecology

## 2021-02-11 ENCOUNTER — Ambulatory Visit (INDEPENDENT_AMBULATORY_CARE_PROVIDER_SITE_OTHER): Payer: Medicaid Other | Admitting: Obstetrics and Gynecology

## 2021-02-11 VITALS — BP 106/62 | HR 88 | Resp 16 | Ht 66.0 in | Wt 133.0 lb

## 2021-02-11 DIAGNOSIS — Z01419 Encounter for gynecological examination (general) (routine) without abnormal findings: Secondary | ICD-10-CM

## 2021-02-11 DIAGNOSIS — Z30431 Encounter for routine checking of intrauterine contraceptive device: Secondary | ICD-10-CM

## 2021-02-11 NOTE — Progress Notes (Signed)
GYNECOLOGY ANNUAL PREVENTATIVE CARE ENCOUNTER NOTE  History:     Sydney Ryan is a 35 y.o. [phone] female here for a routine annual gynecologic exam.  Current complaints: none   Denies abnormal vaginal bleeding, discharge, pelvic pain, problems with intercourse or other gynecologic concerns.    Gynecologic History No LMP recorded. (Menstrual status: IUD). Contraception: IUD Last Pap: 2021. Results were: normal with negative HPV Last mammogram: Tissue diagnoses in 2021- Normal biopsy.   Obstetric History OB History  Gravida Para Term Preterm AB Living  3 3 3     3   SAB IAB Ectopic Multiple Live Births          3    # Outcome Date GA Lbr Len/2nd Weight Sex Delivery Anes PTL Lv  3 Term 02/27/12 [redacted]w[redacted]d -17:00 / 00:22 7 lb 3.5 oz (3.275 kg) F Vag-Spont EPI  LIV  2 Term 09/06/08 [redacted]w[redacted]d  8 lb 8 oz (3.856 kg) M Vag-Spont EPI N LIV     Birth Comments: bilat periurethral lac  1 Term 01/17/06 [redacted]w[redacted]d  8 lb 2 oz (3.685 kg) M Vag-Spont EPI N LIV    Past Medical History:  Diagnosis Date   Abnormal Pap smear of cervix    LGSIL   Anxiety    Cyclic vomiting syndrome     History reviewed. No pertinent surgical history.  Current Outpatient Medications on File Prior to Visit  Medication Sig Dispense Refill   levonorgestrel (MIRENA) 20 MCG/24HR IUD 1 each by Intrauterine route once.     omeprazole (PRILOSEC) 20 MG capsule Take 20 mg by mouth daily.     No current facility-administered medications on file prior to visit.    No Known Allergies  Social History:  reports that she has quit smoking. Her smoking use included cigarettes. She has a 2.70 pack-year smoking history. She has never used smokeless tobacco. She reports previous alcohol use. She reports that she does not use drugs.  Family History  Problem Relation Age of Onset   Hyperlipidemia Maternal Grandfather    Diabetes Maternal Grandfather    Hypertension Mother    Hypertension Father     The following portions  of the patient's history were reviewed and updated as appropriate: allergies, current medications, past family history, past medical history, past social history, past surgical history and problem list.  Review of Systems Pertinent items noted in HPI and remainder of comprehensive ROS otherwise negative.  Physical Exam:  BP 106/62   Pulse 88   Resp 16   Ht 5\' 6"  (1.676 m)   Wt 133 lb (60.3 kg)   BMI 21.47 kg/m  CONSTITUTIONAL: Well-developed, well-nourished female in no acute distress.  HENT:  Normocephalic, atraumatic, External right and left ear normal.  EYES: Conjunctivae and EOM are normal. Pupils are equal, round, and reactive to light. No scleral icterus.  NECK: Normal range of motion, supple, no masses.  Normal thyroid.  SKIN: Skin is warm and dry. No rash noted. Not diaphoretic. No erythema. No pallor. MUSCULOSKELETAL: Normal range of motion. No tenderness.  No cyanosis, clubbing, or edema. NEUROLOGIC: Alert and oriented to person, place, and time. Normal reflexes, muscle tone coordination.  PSYCHIATRIC: Normal mood and affect. Normal behavior. Normal judgment and thought content. CARDIOVASCULAR: Normal heart rate noted, regular rhythm RESPIRATORY: Clear to auscultation bilaterally. Effort and breath sounds normal, no problems with respiration noted. BREASTS: Symmetric in size. No masses, tenderness, skin changes, nipple drainage, or lymphadenopathy bilaterally. Performed in the presence of a  chaperone. ABDOMEN: Soft, no distention noted.  No tenderness, rebound or guarding.  PELVIC: Normal appearing external genitalia and urethral meatus; Normal uterine size, no other palpable masses, no uterine or adnexal tenderness.  IUD strings palpated. Pap not collected, not due. Performed in the presence of a chaperone.   Assessment and Plan:   1. Women's annual routine gynecological examination  - TSH  - She was told by PCP last Nov that TSH was off.   Tajh Livsey, Harolyn Rutherford, NP Faculty  [facility] for [employer], [facility]

## 2021-02-12 LAB — TSH: TSH: 1.08 mIU/L

## 2021-03-24 ENCOUNTER — Other Ambulatory Visit: Payer: Self-pay

## 2021-03-24 ENCOUNTER — Ambulatory Visit
Admission: RE | Admit: 2021-03-24 | Discharge: 2021-03-24 | Disposition: A | Discharge status: Home / Self Care | Payer: No Typology Code available for payment source | Source: Ambulatory Visit | Attending: Obstetrics and Gynecology | Admitting: Obstetrics and Gynecology

## 2021-03-24 DIAGNOSIS — N63 Unspecified lump in unspecified breast: Secondary | ICD-10-CM

## 2022-02-20 ENCOUNTER — Ambulatory Visit: Payer: Self-pay | Admitting: Obstetrics and Gynecology

## 2022-06-23 ENCOUNTER — Ambulatory Visit: Payer: Self-pay | Admitting: Obstetrics and Gynecology

## 2022-07-10 ENCOUNTER — Encounter: Payer: Self-pay | Admitting: Obstetrics and Gynecology

## 2022-07-10 ENCOUNTER — Ambulatory Visit (INDEPENDENT_AMBULATORY_CARE_PROVIDER_SITE_OTHER): Payer: Medicaid Other | Admitting: Obstetrics and Gynecology

## 2022-07-10 VITALS — BP 119/65 | HR 78 | Resp 16 | Ht 66.0 in | Wt 143.0 lb

## 2022-07-10 DIAGNOSIS — Z01419 Encounter for gynecological examination (general) (routine) without abnormal findings: Secondary | ICD-10-CM | POA: Diagnosis not present

## 2022-07-10 DIAGNOSIS — Z30432 Encounter for removal of intrauterine contraceptive device: Secondary | ICD-10-CM | POA: Diagnosis not present

## 2022-07-10 DIAGNOSIS — F419 Anxiety disorder, unspecified: Secondary | ICD-10-CM

## 2022-07-10 MED ORDER — NORGESTIMATE-ETH ESTRADIOL 0.25-35 MG-MCG PO TABS: 1.0000 | ORAL_TABLET | Freq: Every day | ORAL | 11 refills | Status: DC

## 2022-07-10 MED ORDER — ALPRAZOLAM 0.25 MG PO TABS: 0.2500 mg | ORAL_TABLET | Freq: Every evening | ORAL | 0 refills | Status: AC | PRN

## 2022-07-10 NOTE — Progress Notes (Signed)
GYNECOLOGY ANNUAL PREVENTATIVE CARE ENCOUNTER NOTE  History:     Sydney Ryan is a 36 y.o. [phone] female here for a routine annual gynecologic exam.  Current complaints: None.Desires IUD removal today.   Denies abnormal vaginal bleeding, discharge, pelvic pain, problems with intercourse or other gynecologic concerns.   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 Hoping the IUD removal will help with anxiety. Would like to try OCPS. Requesting a small refill on anti anxiety medication. Does not want to be on something she takes every day. She is interested in seeing a therapist fo anxiety coping strategies.   Gynecologic History No LMP recorded. (Menstrual status: IUD). Contraception: IUD Last Pap: 2021. Result was normal with negative HPV   Obstetric History OB History  Gravida Para Term Preterm AB Living  3 3 3     3   SAB IAB Ectopic Multiple Live Births          3    # Outcome Date GA Lbr Len/2nd Weight Sex Delivery Anes PTL Lv  3 Term 02/27/12 [redacted]w[redacted]d -17:00 / 00:22 7 lb 3.5 oz (3.275 kg) F Vag-Spont EPI  LIV  2 Term 09/06/08 [redacted]w[redacted]d  8 lb 8 oz (3.856 kg) M Vag-Spont EPI N LIV     Birth Comments: bilat periurethral lac  1 Term 01/17/06 [redacted]w[redacted]d  8 lb 2 oz (3.685 kg) M Vag-Spont EPI N LIV    Past Medical History:  Diagnosis Date   Abnormal Pap smear of cervix    LGSIL   Anxiety    Cyclic vomiting syndrome     History reviewed. No pertinent surgical history.  Current Outpatient Medications on File Prior to Visit  Medication Sig Dispense Refill   levonorgestrel (MIRENA) 20 MCG/24HR IUD 1 each by Intrauterine route once.     omeprazole (PRILOSEC) 20 MG capsule Take 20 mg by mouth daily. (Patient not taking: Reported on 07/10/2022)     No current facility-administered medications on file prior to visit.    No Known Allergies  Social History:  reports that she has quit smoking. Her smoking use included cigarettes. She has a 2.70 pack-year  smoking history. She has never used smokeless tobacco. She reports that she does not currently use alcohol. She reports that she does not use drugs.  Family History  Problem Relation Age of Onset   Hyperlipidemia Maternal Grandfather    Diabetes Maternal Grandfather    Hypertension Mother    Hypertension Father     The following portions of the patient's history were reviewed and updated as appropriate: allergies, current medications, past family history, past medical history, past social history, past surgical history and problem list.  Review of Systems Pertinent items noted in HPI and remainder of comprehensive ROS otherwise negative.  Physical Exam:  BP 119/65   Pulse 78   Resp 16   Ht 5\' 6"  (1.676 m)   Wt 143 lb (64.9 kg)   BMI 23.08 kg/m  CONSTITUTIONAL: Well-developed, well-nourished female in no acute distress.  HENT:  Normocephalic, atraumatic, External right and left ear normal.  EYES: Conjunctivae and EOM are normal. Pupils are equal, round, and reactive to light. No scleral icterus.  NECK: Normal range of motion, supple, no masses.  Normal thyroid.  SKIN: Skin is warm and dry. No rash noted. Not diaphoretic. No erythema. No pallor. MUSCULOSKELETAL: Normal range of motion. No tenderness.  No cyanosis, clubbing, or edema. NEUROLOGIC: Alert and oriented to person, place, and time. Normal  reflexes, muscle tone coordination.  PSYCHIATRIC: Normal mood and affect. Normal behavior. Normal judgment and thought content. CARDIOVASCULAR: Normal heart rate noted, regular rhythm RESPIRATORY: Clear to auscultation bilaterally. Effort and breath sounds normal, no problems with respiration noted. BREASTS: Symmetric in size. No masses, tenderness, skin changes, nipple drainage, or lymphadenopathy bilaterally. Performed in the presence of a chaperone. ABDOMEN: Soft, no distention noted.  No tenderness, rebound or guarding.  PELVIC: Normal appearing external genitalia and urethral meatus;  normal appearing vaginal mucosa and cervix.  No abnormal vaginal discharge noted.  IUD removal without issue. Performed in the presence of a chaperone.   Assessment and Plan:   1. Encounter for IUD removal  IUD removed without issues Rx: sprintec   2. Women's annual routine gynecological examination  - pap not due today.   3. Episode of anxiety  Small course of xanax. She has used this in the past. Recommend regular therapy.     IUD Removal   Patient was in the dorsal lithotomy position, normal external genitalia was noted.  A speculum was placed in the patient's vagina, normal discharge was noted, no lesions. The multiparous cervix was visualized, no lesions, no abnormal discharge.  The strings of the IUD were grasped and pulled using ring forceps. The IUD was removed in its entirety. Patient tolerated the procedure well.     Venia Carbon I, NP 07/10/2022 11:08 AM

## 2022-09-17 ENCOUNTER — Telehealth: Payer: Self-pay

## 2022-09-17 NOTE — Telephone Encounter (Signed)
Mychart msg sent. AS, CMA 

## 2023-01-02 DIAGNOSIS — K5900 Constipation, unspecified: Secondary | ICD-10-CM | POA: Diagnosis not present

## 2023-01-02 DIAGNOSIS — Z79899 Other long term (current) drug therapy: Secondary | ICD-10-CM | POA: Diagnosis not present

## 2023-01-02 DIAGNOSIS — Z7409 Other reduced mobility: Secondary | ICD-10-CM | POA: Diagnosis not present

## 2023-01-02 DIAGNOSIS — S82852A Displaced trimalleolar fracture of left lower leg, initial encounter for closed fracture: Secondary | ICD-10-CM | POA: Diagnosis not present

## 2023-01-02 DIAGNOSIS — S8992XA Unspecified injury of left lower leg, initial encounter: Secondary | ICD-10-CM | POA: Diagnosis not present

## 2023-01-02 DIAGNOSIS — F419 Anxiety disorder, unspecified: Secondary | ICD-10-CM | POA: Diagnosis not present

## 2023-01-03 DIAGNOSIS — S82852A Displaced trimalleolar fracture of left lower leg, initial encounter for closed fracture: Secondary | ICD-10-CM | POA: Diagnosis not present

## 2023-01-06 DIAGNOSIS — S82852A Displaced trimalleolar fracture of left lower leg, initial encounter for closed fracture: Secondary | ICD-10-CM | POA: Diagnosis not present

## 2023-02-17 ENCOUNTER — Ambulatory Visit (INDEPENDENT_AMBULATORY_CARE_PROVIDER_SITE_OTHER): Payer: Self-pay | Admitting: Family Medicine

## 2023-02-17 DIAGNOSIS — Z91199 Patient's noncompliance with other medical treatment and regimen due to unspecified reason: Secondary | ICD-10-CM

## 2023-02-17 NOTE — Progress Notes (Signed)
No show

## 2023-07-13 ENCOUNTER — Ambulatory Visit: Payer: Medicaid Other | Admitting: Obstetrics and Gynecology

## 2023-08-31 ENCOUNTER — Encounter: Payer: Self-pay | Admitting: Obstetrics and Gynecology

## 2023-08-31 ENCOUNTER — Ambulatory Visit (INDEPENDENT_AMBULATORY_CARE_PROVIDER_SITE_OTHER): Payer: BC Managed Care – PPO | Admitting: Obstetrics and Gynecology

## 2023-08-31 VITALS — BP 125/80 | HR 71 | Ht 66.0 in | Wt 143.0 lb

## 2023-08-31 DIAGNOSIS — Z01419 Encounter for gynecological examination (general) (routine) without abnormal findings: Secondary | ICD-10-CM | POA: Diagnosis not present

## 2023-08-31 MED ORDER — PROMETHAZINE HCL 25 MG PO TABS: 25.0000 mg | ORAL_TABLET | Freq: Four times a day (QID) | ORAL | 0 refills | Status: AC | PRN

## 2023-08-31 MED ORDER — NORGESTIMATE-ETH ESTRADIOL 0.25-35 MG-MCG PO TABS: 1.0000 | ORAL_TABLET | Freq: Every day | ORAL | 11 refills | Status: DC

## 2023-08-31 NOTE — Progress Notes (Signed)
 GYNECOLOGY ANNUAL PREVENTATIVE CARE ENCOUNTER NOTE  History:     Brelynn Nicole Balfour is a 37 y.o. [phone] female here for a routine annual gynecologic exam.  Current complaints: recently broke her ankle, had multiple surgeries. Seeing [facility] for pain management. Trying to come off of all pain medication..   Denies abnormal vaginal bleeding, discharge, pelvic pain, problems with intercourse or other gynecologic concerns.  Cystic vomiting post surgery on ankle due to stress.  Plans to see GI again but not wanting to at this time.    Gynecologic History Patient's last menstrual period was 08/21/2023. Contraception: OCP (estrogen/progesterone) Last Pap: 2021. Result was normal with negative HPV Last Mammogram: NA  Obstetric History OB History  Gravida Para Term Preterm AB Living  3 3 3   3   SAB IAB Ectopic Multiple Live Births      3    # Outcome Date GA Lbr Len/2nd Weight Sex Type Anes PTL Lv  3 Term 02/27/12 [redacted]w[redacted]d -17:00 / 00:22 7 lb 3.5 oz (3.275 kg) F Vag-Spont EPI  LIV  2 Term 09/06/08 [redacted]w[redacted]d  8 lb 8 oz (3.856 kg) M Vag-Spont EPI N LIV     Birth Comments: bilat periurethral lac  1 Term 01/17/06 [redacted]w[redacted]d  8 lb 2 oz (3.685 kg) M Vag-Spont EPI N LIV    Past Medical History:  Diagnosis Date   Abnormal Pap smear of cervix    LGSIL   Anxiety    Cyclic vomiting syndrome     Past Surgical History:  Procedure Laterality Date   ANKLE SURGERY      Current Outpatient Medications on File Prior to Visit  Medication Sig Dispense Refill   HYDROmorphone (DILAUDID) 4 MG tablet Take 4 mg by mouth every 8 (eight) hours as needed.     norgestimate -ethinyl estradiol  (ORTHO-CYCLEN) 0.25-35 MG-MCG tablet Take 1 tablet by mouth daily. 28 tablet 11   ALPRAZolam  (XANAX ) 0.25 MG tablet Take 1 tablet (0.25 mg total) by mouth at bedtime as needed for anxiety. (Patient not taking: Reported on 08/31/2023) 30 tablet 0   No current facility-administered medications on file prior to visit.     No Known Allergies  Social History:  reports that she has quit smoking. Her smoking use included cigarettes. She has a 2.7 pack-year smoking history. She has never used smokeless tobacco. She reports that she does not currently use alcohol. She reports that she does not use drugs.  Family History  Problem Relation Age of Onset   Hyperlipidemia Maternal Grandfather    Diabetes Maternal Grandfather    Hypertension Mother    Hypertension Father     The following portions of the patient's history were reviewed and updated as appropriate: allergies, current medications, past family history, past medical history, past social history, past surgical history and problem list.  Review of Systems Pertinent items noted in HPI and remainder of comprehensive ROS otherwise negative.  Physical Exam:  BP 125/80   Pulse 71   Ht 5' 6 (1.676 m)   Wt 143 lb (64.9 kg)   LMP 08/21/2023   BMI 23.08 kg/m  CONSTITUTIONAL: Well-developed, well-nourished female in no acute distress.  HENT:  Normocephalic, atraumatic, External right and left ear normal.  EYES: Conjunctivae and EOM are normal. Pupils are equal, round, and reactive to light. No scleral icterus.  NECK: Normal range of motion, supple, no masses.  Normal thyroid.  SKIN: Skin is warm and dry. No rash noted. Not diaphoretic. No erythema. No pallor.  MUSCULOSKELETAL: Normal range of motion. No tenderness.  No cyanosis, clubbing, or edema. NEUROLOGIC: Alert and oriented to person, place, and time. Normal reflexes, muscle tone coordination.  PSYCHIATRIC: Normal mood and affect. Normal behavior. Normal judgment and thought content. CARDIOVASCULAR: Normal heart rate noted, regular rhythm RESPIRATORY: Clear to auscultation bilaterally. Effort and breath sounds normal, no problems with respiration noted. BREASTS: Symmetric in size. No masses, tenderness, skin changes, nipple drainage, or lymphadenopathy bilaterally. Performed in the presence of a  chaperone. ABDOMEN: Soft, no distention noted.  No tenderness, rebound or guarding.  PELVIC: Deferred    Assessment and Plan:   1. Women's annual routine gynecological examination (Primary)  - MM Digital Screening; Future  - She declines PAP, still within ACCP guidelines.  - She is seeing [facility] for pain management. Our office is unable to fill pain medication refills. Discussed transferring care to [facility] primary care.  - Rx phenergan  for n/v. Recommend seeing GI  Normal breast examination today, she was advised to perform periodic self breast examinations.  Mammogram scheduled for breast cancer screening, she turns 38 in 2025 Routine preventative health maintenance measures emphasized. Please refer to After Visit Summary for other counseling recommendations.   Florabel Faulks, Delon FERNS, NP [facility] [employer], [facility]

## 2024-06-06 ENCOUNTER — Other Ambulatory Visit: Payer: Self-pay

## 2024-06-06 MED ORDER — NORGESTIMATE-ETH ESTRADIOL 0.25-35 MG-MCG PO TABS: 1.0000 | ORAL_TABLET | Freq: Every day | ORAL | 2 refills | Status: AC

## 2024-06-06 NOTE — Progress Notes (Signed)
 Patient requests to send  Rx to [facility] in [place].
